# Patient Record
Sex: Male | Born: 1940 | Race: White | Hispanic: Yes | Smoking: Former smoker
Health system: Southern US, Community
[De-identification: ages and names within clinical notes are randomized; demographics above are authoritative.]

## PROBLEM LIST (undated history)

## (undated) DIAGNOSIS — A048 Other specified bacterial intestinal infections: Secondary | ICD-10-CM

## (undated) DIAGNOSIS — H402290 Chronic angle-closure glaucoma, unspecified eye, stage unspecified: Secondary | ICD-10-CM

## (undated) DIAGNOSIS — N281 Cyst of kidney, acquired: Secondary | ICD-10-CM

## (undated) DIAGNOSIS — I10 Essential (primary) hypertension: Secondary | ICD-10-CM

## (undated) DIAGNOSIS — K4090 Unilateral inguinal hernia, without obstruction or gangrene, not specified as recurrent: Secondary | ICD-10-CM

## (undated) DIAGNOSIS — M79651 Pain in right thigh: Secondary | ICD-10-CM

## (undated) DIAGNOSIS — M79646 Pain in unspecified finger(s): Secondary | ICD-10-CM

## (undated) DIAGNOSIS — M1990 Unspecified osteoarthritis, unspecified site: Secondary | ICD-10-CM

## (undated) DIAGNOSIS — E119 Type 2 diabetes mellitus without complications: Secondary | ICD-10-CM

## (undated) DIAGNOSIS — E781 Pure hyperglyceridemia: Secondary | ICD-10-CM

## (undated) DIAGNOSIS — H1030 Unspecified acute conjunctivitis, unspecified eye: Secondary | ICD-10-CM

## (undated) DIAGNOSIS — R197 Diarrhea, unspecified: Secondary | ICD-10-CM

## (undated) DIAGNOSIS — K1120 Sialoadenitis, unspecified: Secondary | ICD-10-CM

## (undated) DIAGNOSIS — N400 Enlarged prostate without lower urinary tract symptoms: Secondary | ICD-10-CM

## (undated) DIAGNOSIS — D126 Benign neoplasm of colon, unspecified: Secondary | ICD-10-CM

## (undated) DIAGNOSIS — J209 Acute bronchitis, unspecified: Secondary | ICD-10-CM

## (undated) DIAGNOSIS — K2970 Gastritis, unspecified, without bleeding: Secondary | ICD-10-CM

## (undated) DIAGNOSIS — F528 Other sexual dysfunction not due to a substance or known physiological condition: Secondary | ICD-10-CM

## (undated) DIAGNOSIS — R05 Cough: Secondary | ICD-10-CM

## (undated) DIAGNOSIS — M5431 Sciatica, right side: Secondary | ICD-10-CM

## (undated) DIAGNOSIS — Z860100 Personal history of colon polyps, unspecified: Secondary | ICD-10-CM

## (undated) DIAGNOSIS — K118 Other diseases of salivary glands: Secondary | ICD-10-CM

## (undated) DIAGNOSIS — B356 Tinea cruris: Secondary | ICD-10-CM

## (undated) DIAGNOSIS — K2990 Gastroduodenitis, unspecified, without bleeding: Secondary | ICD-10-CM

## (undated) DIAGNOSIS — R21 Rash and other nonspecific skin eruption: Secondary | ICD-10-CM

## (undated) DIAGNOSIS — M722 Plantar fascial fibromatosis: Secondary | ICD-10-CM

## (undated) DIAGNOSIS — E7800 Pure hypercholesterolemia, unspecified: Secondary | ICD-10-CM

## (undated) HISTORY — DX: Sialoadenitis, unspecified: K11.20

## (undated) HISTORY — DX: Acute bronchitis, unspecified: J20.9

## (undated) HISTORY — PX: OTHER SURGICAL HISTORY: SHX169

## (undated) HISTORY — DX: Unilateral inguinal hernia, without obstruction or gangrene, not specified as recurrent: K40.90

## (undated) HISTORY — DX: Rash and other nonspecific skin eruption: R21

## (undated) HISTORY — DX: Cyst of kidney, acquired: N28.1

## (undated) HISTORY — DX: Diarrhea, unspecified: R19.7

## (undated) HISTORY — DX: Tinea cruris: B35.6

## (undated) HISTORY — DX: Chronic angle-closure glaucoma, unspecified eye, stage unspecified: H40.2290

## (undated) HISTORY — DX: Pain in unspecified finger(s): M79.646

## (undated) HISTORY — DX: Benign prostatic hyperplasia without lower urinary tract symptoms: N40.0

## (undated) HISTORY — DX: Type 2 diabetes mellitus without complications: E11.9

## (undated) HISTORY — DX: Cough: R05

## (undated) HISTORY — DX: Essential (primary) hypertension: I10

## (undated) HISTORY — DX: Pain in right thigh: M79.651

## (undated) HISTORY — DX: Other specified bacterial intestinal infections: A04.8

## (undated) HISTORY — DX: Personal history of colonic polyps: Z86.010

## (undated) HISTORY — DX: Pure hypercholesterolemia, unspecified: E78.00

## (undated) HISTORY — DX: Gastritis, unspecified, without bleeding: K29.70

## (undated) HISTORY — DX: Pure hyperglyceridemia: E78.1

## (undated) HISTORY — PX: CATARACT EXTRACTION: SUR2

## (undated) HISTORY — PX: HERNIA REPAIR: SHX51

## (undated) HISTORY — DX: Personal history of colon polyps, unspecified: Z86.0100

## (undated) HISTORY — DX: Gastroduodenitis, unspecified, without bleeding: K29.90

## (undated) HISTORY — DX: Unspecified acute conjunctivitis, unspecified eye: H10.30

## (undated) HISTORY — DX: Other diseases of salivary glands: K11.8

## (undated) HISTORY — DX: Other sexual dysfunction not due to a substance or known physiological condition: F52.8

## (undated) HISTORY — DX: Benign neoplasm of colon, unspecified: D12.6

## (undated) HISTORY — DX: Unspecified osteoarthritis, unspecified site: M19.90

## (undated) HISTORY — DX: Plantar fascial fibromatosis: M72.2

## (undated) HISTORY — PX: EYE SURGERY: SHX253

## (undated) HISTORY — DX: Sciatica, right side: M54.31

---

## 1997-11-08 ENCOUNTER — Ambulatory Visit (HOSPITAL_COMMUNITY): Admission: RE | Admit: 1997-11-08 | Discharge: 1997-11-08 | Payer: Self-pay | Admitting: Orthopedic Surgery

## 1998-11-01 ENCOUNTER — Other Ambulatory Visit: Admission: RE | Admit: 1998-11-01 | Discharge: 1998-11-01 | Payer: Self-pay | Admitting: Gastroenterology

## 1999-01-25 ENCOUNTER — Ambulatory Visit (HOSPITAL_BASED_OUTPATIENT_CLINIC_OR_DEPARTMENT_OTHER): Admission: RE | Admit: 1999-01-25 | Discharge: 1999-01-25 | Payer: Self-pay

## 1999-12-16 ENCOUNTER — Encounter: Payer: Self-pay | Admitting: Family Medicine

## 1999-12-16 ENCOUNTER — Encounter: Admission: RE | Admit: 1999-12-16 | Discharge: 1999-12-16 | Payer: Self-pay | Admitting: Family Medicine

## 1999-12-30 ENCOUNTER — Encounter: Admission: RE | Admit: 1999-12-30 | Discharge: 1999-12-30 | Payer: Self-pay | Admitting: Family Medicine

## 1999-12-30 ENCOUNTER — Encounter: Payer: Self-pay | Admitting: Family Medicine

## 2001-12-27 ENCOUNTER — Observation Stay (HOSPITAL_COMMUNITY): Admission: RE | Admit: 2001-12-27 | Discharge: 2001-12-28 | Payer: Self-pay | Admitting: Orthopedic Surgery

## 2005-05-25 ENCOUNTER — Encounter: Admission: RE | Admit: 2005-05-25 | Discharge: 2005-05-25 | Payer: Self-pay | Admitting: Gastroenterology

## 2005-07-24 ENCOUNTER — Encounter: Admission: RE | Admit: 2005-07-24 | Discharge: 2005-07-24 | Payer: Self-pay | Admitting: Gastroenterology

## 2007-06-24 ENCOUNTER — Encounter: Admission: RE | Admit: 2007-06-24 | Discharge: 2007-06-24 | Payer: Self-pay | Admitting: Gastroenterology

## 2008-05-01 ENCOUNTER — Ambulatory Visit: Payer: Self-pay | Admitting: Internal Medicine

## 2008-05-01 DIAGNOSIS — E7800 Pure hypercholesterolemia, unspecified: Secondary | ICD-10-CM

## 2008-05-01 DIAGNOSIS — D126 Benign neoplasm of colon, unspecified: Secondary | ICD-10-CM

## 2008-05-01 DIAGNOSIS — I152 Hypertension secondary to endocrine disorders: Secondary | ICD-10-CM | POA: Insufficient documentation

## 2008-05-01 DIAGNOSIS — I10 Essential (primary) hypertension: Secondary | ICD-10-CM | POA: Insufficient documentation

## 2008-05-01 DIAGNOSIS — Z860100 Personal history of colon polyps, unspecified: Secondary | ICD-10-CM | POA: Insufficient documentation

## 2008-05-01 HISTORY — DX: Benign neoplasm of colon, unspecified: D12.6

## 2008-05-01 LAB — CONVERTED CEMR LAB
Alkaline Phosphatase: 51 units/L (ref 39–117)
Bilirubin, Direct: 0.2 mg/dL (ref 0.0–0.3)
CO2: 31 meq/L (ref 19–32)
Crystals: NEGATIVE
Eosinophils Relative: 2.5 % (ref 0.0–5.0)
GFR calc Af Amer: 85 mL/min
Glucose, Bld: 98 mg/dL (ref 70–99)
HCT: 44.1 % (ref 39.0–52.0)
Lymphocytes Relative: 30.5 % (ref 12.0–46.0)
Monocytes Relative: 13.1 % — ABNORMAL HIGH (ref 3.0–12.0)
Neutrophils Relative %: 53.1 % (ref 43.0–77.0)
Nitrite: NEGATIVE
Platelets: 212 10*3/uL (ref 150–400)
Potassium: 3.8 meq/L (ref 3.5–5.1)
Sodium: 140 meq/L (ref 135–145)
Specific Gravity, Urine: 1.02 (ref 1.000–1.03)
TSH: 2.81 microintl units/mL (ref 0.35–5.50)
Total Bilirubin: 1.4 mg/dL — ABNORMAL HIGH (ref 0.3–1.2)
Total Protein, Urine: NEGATIVE mg/dL
Total Protein: 7 g/dL (ref 6.0–8.3)
Urine Glucose: NEGATIVE mg/dL
WBC: 5.9 10*3/uL (ref 4.5–10.5)

## 2008-05-02 ENCOUNTER — Encounter: Payer: Self-pay | Admitting: Internal Medicine

## 2008-05-04 ENCOUNTER — Encounter: Payer: Self-pay | Admitting: Internal Medicine

## 2008-05-04 ENCOUNTER — Encounter: Admission: RE | Admit: 2008-05-04 | Discharge: 2008-05-04 | Payer: Self-pay | Admitting: Internal Medicine

## 2008-05-21 ENCOUNTER — Ambulatory Visit: Payer: Self-pay | Admitting: Internal Medicine

## 2008-05-21 LAB — CONVERTED CEMR LAB
Cholesterol, target level: 200 mg/dL
HDL goal, serum: 40 mg/dL

## 2008-08-25 ENCOUNTER — Ambulatory Visit: Payer: Self-pay | Admitting: Internal Medicine

## 2008-08-25 LAB — CONVERTED CEMR LAB
ALT: 27 units/L (ref 0–53)
AST: 24 units/L (ref 0–37)
Albumin: 3.9 g/dL (ref 3.5–5.2)
Amylase: 96 units/L (ref 27–131)
BUN: 22 mg/dL (ref 6–23)
Bacteria, UA: NEGATIVE
Basophils Absolute: 0 10*3/uL (ref 0.0–0.1)
Basophils Relative: 0.5 % (ref 0.0–3.0)
Bilirubin Urine: NEGATIVE
CO2: 31 meq/L (ref 19–32)
Calcium: 9.1 mg/dL (ref 8.4–10.5)
Chloride: 106 meq/L (ref 96–112)
Cholesterol: 192 mg/dL (ref 0–200)
Creatinine, Ser: 0.8 mg/dL (ref 0.4–1.5)
Eosinophils Absolute: 0.2 10*3/uL (ref 0.0–0.7)
GFR calc non Af Amer: 102 mL/min
HCT: 43.4 % (ref 39.0–52.0)
Hemoglobin: 15.4 g/dL (ref 13.0–17.0)
Ketones, ur: NEGATIVE mg/dL
LDL Cholesterol: 115 mg/dL — ABNORMAL HIGH (ref 0–99)
Leukocytes, UA: NEGATIVE
Lipase: 32 units/L (ref 11.0–59.0)
Lymphocytes Relative: 35.1 % (ref 12.0–46.0)
MCHC: 35.6 g/dL (ref 30.0–36.0)
MCV: 92 fL (ref 78.0–100.0)
Monocytes Absolute: 0.7 10*3/uL (ref 0.1–1.0)
Neutro Abs: 2.5 10*3/uL (ref 1.4–7.7)
Nitrite: NEGATIVE
RBC: 4.72 M/uL (ref 4.22–5.81)
RDW: 11.9 % (ref 11.5–14.6)
Total Bilirubin: 1.2 mg/dL (ref 0.3–1.2)
Triglycerides: 184 mg/dL — ABNORMAL HIGH (ref 0–149)
pH: 5 (ref 5.0–8.0)

## 2008-08-26 ENCOUNTER — Encounter: Payer: Self-pay | Admitting: Internal Medicine

## 2008-08-26 ENCOUNTER — Telehealth: Payer: Self-pay | Admitting: Internal Medicine

## 2008-08-31 ENCOUNTER — Encounter: Payer: Self-pay | Admitting: Internal Medicine

## 2008-08-31 ENCOUNTER — Encounter: Admission: RE | Admit: 2008-08-31 | Discharge: 2008-08-31 | Payer: Self-pay | Admitting: Internal Medicine

## 2008-09-07 ENCOUNTER — Telehealth (INDEPENDENT_AMBULATORY_CARE_PROVIDER_SITE_OTHER): Payer: Self-pay | Admitting: *Deleted

## 2008-09-07 DIAGNOSIS — N281 Cyst of kidney, acquired: Secondary | ICD-10-CM

## 2008-09-16 ENCOUNTER — Telehealth (INDEPENDENT_AMBULATORY_CARE_PROVIDER_SITE_OTHER): Payer: Self-pay | Admitting: *Deleted

## 2008-09-17 ENCOUNTER — Telehealth (INDEPENDENT_AMBULATORY_CARE_PROVIDER_SITE_OTHER): Payer: Self-pay | Admitting: *Deleted

## 2008-09-30 ENCOUNTER — Telehealth: Payer: Self-pay | Admitting: Internal Medicine

## 2008-10-06 ENCOUNTER — Encounter: Payer: Self-pay | Admitting: Internal Medicine

## 2008-10-09 ENCOUNTER — Encounter: Admission: RE | Admit: 2008-10-09 | Discharge: 2008-10-09 | Payer: Self-pay | Admitting: Gastroenterology

## 2008-10-13 ENCOUNTER — Encounter: Payer: Self-pay | Admitting: Internal Medicine

## 2008-11-02 ENCOUNTER — Encounter: Payer: Self-pay | Admitting: Internal Medicine

## 2008-11-11 ENCOUNTER — Ambulatory Visit: Payer: Self-pay | Admitting: Internal Medicine

## 2008-11-11 ENCOUNTER — Encounter (INDEPENDENT_AMBULATORY_CARE_PROVIDER_SITE_OTHER): Payer: Self-pay | Admitting: *Deleted

## 2008-11-11 DIAGNOSIS — E119 Type 2 diabetes mellitus without complications: Secondary | ICD-10-CM

## 2008-11-11 LAB — CONVERTED CEMR LAB
ALT: 24 units/L (ref 0–53)
AST: 24 units/L (ref 0–37)
Alkaline Phosphatase: 46 units/L (ref 39–117)
BUN: 22 mg/dL (ref 6–23)
Basophils Relative: 0.4 % (ref 0.0–3.0)
Bilirubin Urine: NEGATIVE
Bilirubin, Direct: 0.2 mg/dL (ref 0.0–0.3)
Calcium: 9 mg/dL (ref 8.4–10.5)
Creatinine, Ser: 0.9 mg/dL (ref 0.4–1.5)
Creatinine,U: 90 mg/dL
Eosinophils Relative: 2.7 % (ref 0.0–5.0)
GFR calc non Af Amer: 88.72 mL/min (ref >60)
Hemoglobin, Urine: NEGATIVE
Hgb A1c MFr Bld: 6.3 % (ref 4.6–6.5)
Ketones, ur: NEGATIVE mg/dL
Leukocytes, UA: NEGATIVE
Lipase: 41 units/L (ref 11.0–59.0)
Lymphocytes Relative: 25.4 % (ref 12.0–46.0)
Lymphs Abs: 1.3 10*3/uL (ref 0.7–4.0)
Microalb Creat Ratio: 2.2 mg/g (ref 0.0–30.0)
Microalb, Ur: 0.2 mg/dL (ref 0.0–1.9)
Monocytes Relative: 11 % (ref 3.0–12.0)
Neutrophils Relative %: 60.5 % (ref 43.0–77.0)
Platelets: 157 10*3/uL (ref 150.0–400.0)
RBC: 4.65 M/uL (ref 4.22–5.81)
Specific Gravity, Urine: 1.025 (ref 1.000–1.030)
Total Bilirubin: 1.7 mg/dL — ABNORMAL HIGH (ref 0.3–1.2)
Total CK: 117 units/L (ref 7–232)
Total Protein: 6.4 g/dL (ref 6.0–8.3)
Urine Glucose: NEGATIVE mg/dL
WBC: 5 10*3/uL (ref 4.5–10.5)
pH: 5.5 (ref 5.0–8.0)

## 2008-11-12 ENCOUNTER — Encounter: Payer: Self-pay | Admitting: Internal Medicine

## 2008-11-23 ENCOUNTER — Ambulatory Visit (HOSPITAL_COMMUNITY): Admission: RE | Admit: 2008-11-23 | Discharge: 2008-11-23 | Payer: Self-pay | Admitting: Gastroenterology

## 2008-11-24 ENCOUNTER — Encounter: Admission: RE | Admit: 2008-11-24 | Discharge: 2008-12-17 | Payer: Self-pay | Admitting: Internal Medicine

## 2008-11-27 ENCOUNTER — Telehealth: Payer: Self-pay | Admitting: Internal Medicine

## 2008-12-18 ENCOUNTER — Ambulatory Visit: Payer: Self-pay | Admitting: Internal Medicine

## 2008-12-18 LAB — CONVERTED CEMR LAB: LDL Goal: 100 mg/dL

## 2008-12-21 ENCOUNTER — Encounter: Payer: Self-pay | Admitting: Internal Medicine

## 2009-02-24 ENCOUNTER — Telehealth: Payer: Self-pay | Admitting: Internal Medicine

## 2009-05-05 ENCOUNTER — Ambulatory Visit: Payer: Self-pay | Admitting: Internal Medicine

## 2009-05-05 ENCOUNTER — Telehealth: Payer: Self-pay | Admitting: Internal Medicine

## 2009-05-06 ENCOUNTER — Ambulatory Visit: Payer: Self-pay | Admitting: Internal Medicine

## 2009-05-06 LAB — CONVERTED CEMR LAB
BUN: 27 mg/dL — ABNORMAL HIGH (ref 6–23)
Chloride: 101 meq/L (ref 96–112)
GFR calc non Af Amer: 101.5 mL/min (ref >60)
Glucose, Bld: 94 mg/dL (ref 70–99)
Potassium: 4.4 meq/L (ref 3.5–5.1)

## 2009-05-11 ENCOUNTER — Ambulatory Visit: Payer: Self-pay | Admitting: Cardiology

## 2009-05-13 ENCOUNTER — Ambulatory Visit: Payer: Self-pay | Admitting: Internal Medicine

## 2009-05-13 LAB — CONVERTED CEMR LAB
Bilirubin Urine: NEGATIVE
Ketones, ur: NEGATIVE mg/dL
Total Protein, Urine: NEGATIVE mg/dL
pH: 7 (ref 5.0–8.0)

## 2009-06-03 ENCOUNTER — Ambulatory Visit: Payer: Self-pay | Admitting: Internal Medicine

## 2009-06-07 ENCOUNTER — Telehealth: Payer: Self-pay | Admitting: Internal Medicine

## 2009-06-09 ENCOUNTER — Ambulatory Visit: Payer: Self-pay | Admitting: Internal Medicine

## 2009-06-14 ENCOUNTER — Telehealth: Payer: Self-pay | Admitting: Internal Medicine

## 2009-07-06 ENCOUNTER — Ambulatory Visit: Payer: Self-pay | Admitting: Internal Medicine

## 2009-09-14 ENCOUNTER — Ambulatory Visit: Payer: Self-pay | Admitting: Internal Medicine

## 2009-09-14 LAB — CONVERTED CEMR LAB
ALT: 23 units/L (ref 0–53)
AST: 18 units/L (ref 0–37)
Albumin: 4.1 g/dL (ref 3.5–5.2)
Eosinophils Relative: 2.9 % (ref 0.0–5.0)
GFR calc non Af Amer: 101.39 mL/min (ref >60)
HCT: 44.4 % (ref 39.0–52.0)
Hemoglobin: 14.7 g/dL (ref 13.0–17.0)
Ketones, ur: NEGATIVE mg/dL
Leukocytes, UA: NEGATIVE
Lymphs Abs: 1.6 10*3/uL (ref 0.7–4.0)
Microalb Creat Ratio: 4.2 mg/g (ref 0.0–30.0)
Monocytes Relative: 13.5 % — ABNORMAL HIGH (ref 3.0–12.0)
Neutro Abs: 2.4 10*3/uL (ref 1.4–7.7)
Potassium: 3.9 meq/L (ref 3.5–5.1)
Sodium: 143 meq/L (ref 135–145)
Specific Gravity, Urine: >=1.03 (ref 1.000–1.030)
TSH: 2.17 microintl units/mL (ref 0.35–5.50)
Total Bilirubin: 1 mg/dL (ref 0.3–1.2)
WBC: 4.7 10*3/uL (ref 4.5–10.5)
pH: 5.5 (ref 5.0–8.0)

## 2009-10-15 ENCOUNTER — Encounter (INDEPENDENT_AMBULATORY_CARE_PROVIDER_SITE_OTHER): Payer: Self-pay | Admitting: *Deleted

## 2009-10-29 ENCOUNTER — Encounter (INDEPENDENT_AMBULATORY_CARE_PROVIDER_SITE_OTHER): Payer: Self-pay | Admitting: *Deleted

## 2009-11-01 ENCOUNTER — Ambulatory Visit: Payer: Self-pay | Admitting: Gastroenterology

## 2009-11-10 ENCOUNTER — Ambulatory Visit: Payer: Self-pay | Admitting: Gastroenterology

## 2009-11-19 ENCOUNTER — Ambulatory Visit: Payer: Self-pay | Admitting: Gastroenterology

## 2009-11-19 LAB — CONVERTED CEMR LAB
Ferritin: 108.3 ng/mL (ref 22.0–322.0)
Folate: 15.9 ng/mL
Iron: 105 ug/dL (ref 42–165)
Lipase: 21 units/L (ref 11.0–59.0)
Transferrin: 253.1 mg/dL (ref 212.0–360.0)
Vitamin B-12: 614 pg/mL (ref 211–911)

## 2009-11-24 ENCOUNTER — Ambulatory Visit (HOSPITAL_COMMUNITY): Admission: RE | Admit: 2009-11-24 | Discharge: 2009-11-24 | Payer: Self-pay | Admitting: Gastroenterology

## 2009-12-17 ENCOUNTER — Ambulatory Visit: Payer: Self-pay | Admitting: Gastroenterology

## 2009-12-17 ENCOUNTER — Encounter (INDEPENDENT_AMBULATORY_CARE_PROVIDER_SITE_OTHER): Payer: Self-pay | Admitting: *Deleted

## 2009-12-17 DIAGNOSIS — H402290 Chronic angle-closure glaucoma, unspecified eye, stage unspecified: Secondary | ICD-10-CM | POA: Insufficient documentation

## 2010-01-05 ENCOUNTER — Ambulatory Visit: Payer: Self-pay | Admitting: Gastroenterology

## 2010-01-05 DIAGNOSIS — K2970 Gastritis, unspecified, without bleeding: Secondary | ICD-10-CM | POA: Insufficient documentation

## 2010-01-05 DIAGNOSIS — K2990 Gastroduodenitis, unspecified, without bleeding: Secondary | ICD-10-CM

## 2010-01-10 ENCOUNTER — Ambulatory Visit: Payer: Self-pay | Admitting: Gastroenterology

## 2010-01-11 ENCOUNTER — Encounter: Payer: Self-pay | Admitting: Gastroenterology

## 2010-01-12 ENCOUNTER — Telehealth: Payer: Self-pay | Admitting: Gastroenterology

## 2010-01-13 ENCOUNTER — Telehealth: Payer: Self-pay | Admitting: Internal Medicine

## 2010-01-19 ENCOUNTER — Ambulatory Visit: Payer: Self-pay | Admitting: Internal Medicine

## 2010-01-24 ENCOUNTER — Ambulatory Visit: Payer: Self-pay | Admitting: Gastroenterology

## 2010-05-18 ENCOUNTER — Ambulatory Visit: Payer: Self-pay | Admitting: Internal Medicine

## 2010-05-18 DIAGNOSIS — F528 Other sexual dysfunction not due to a substance or known physiological condition: Secondary | ICD-10-CM

## 2010-05-18 LAB — CONVERTED CEMR LAB
ALT: 26 units/L (ref 0–53)
Albumin: 4 g/dL (ref 3.5–5.2)
BUN: 18 mg/dL (ref 6–23)
Basophils Absolute: 0 10*3/uL (ref 0.0–0.1)
CO2: 31 meq/L (ref 19–32)
Chloride: 106 meq/L (ref 96–112)
Glucose, Bld: 127 mg/dL — ABNORMAL HIGH (ref 70–99)
HCT: 44.5 % (ref 39.0–52.0)
Hemoglobin: 15.5 g/dL (ref 13.0–17.0)
Hgb A1c MFr Bld: 6.6 % — ABNORMAL HIGH (ref 4.6–6.5)
Lymphs Abs: 1.6 10*3/uL (ref 0.7–4.0)
MCV: 93.5 fL (ref 78.0–100.0)
Monocytes Absolute: 0.6 10*3/uL (ref 0.1–1.0)
Neutro Abs: 2.8 10*3/uL (ref 1.4–7.7)
PSA: 1.68 ng/mL (ref 0.10–4.00)
Platelets: 199 10*3/uL (ref 150.0–400.0)
Potassium: 4.1 meq/L (ref 3.5–5.1)
RDW: 13.5 % (ref 11.5–14.6)
Sodium: 141 meq/L (ref 135–145)
TSH: 2.98 microintl units/mL (ref 0.35–5.50)
Total Bilirubin: 1.1 mg/dL (ref 0.3–1.2)

## 2010-06-14 ENCOUNTER — Ambulatory Visit: Payer: Self-pay | Admitting: Internal Medicine

## 2010-08-28 ENCOUNTER — Encounter: Payer: Self-pay | Admitting: Gastroenterology

## 2010-09-08 NOTE — Progress Notes (Signed)
Summary: CT scan coverage denied  Phone Note Outgoing Call   Summary of Call: Insurance has denied coverage for the CT scan that is sched for next week.  Denied because there is no evidence of physical findings related to the pain and no recent imaging studies demonstrating suspicion of a lesion.   Initial call taken by: Ashok Cordia RN,  January 12, 2010 10:07 AM  Follow-up for Phone Call        noted,,,offer referral to [facility] if they are interested. Follow-up by: Mardella Layman MD Clementeen Graham,  January 12, 2010 12:16 PM  Additional Follow-up for Phone Call Additional follow up Details #1::        LM for pt or wife to call.  Ashok Cordia RN  January 12, 2010 12:20 PM  Talked with wife.  She will discuss with pt and call back if pt wants referral. Additional Follow-up by: Ashok Cordia RN,  January 12, 2010 3:17 PM

## 2010-09-08 NOTE — Assessment & Plan Note (Signed)
Summary: discuss CT scan ins denial...as.   History of Present Illness Visit Type: Follow-up Visit Primary GI MD: Sheryn Bison MD FACP FAGA Primary Provider: Sanda Linger, MD Chief Complaint: pt continues to have abdominal pain, would like to discuss need for appeal to insurance company for CT--insurance has denied History of Present Illness:   Continued mid abdominal discomfort without alleviating or aggravating factors. No other GI symptoms. I reviewed all of his records again including records from [facility]. He did have abdominal beds pelvic CT scan in October of 20 and which was unremarkable. He has had no anorexia, weight loss, or systemic symptomatology. His blood pressure is well controlled and he is on probiotic therapy and p.r.n. tramadol.   GI Review of Systems    Reports abdominal pain.      Denies acid reflux, belching, bloating, chest pain, dysphagia with liquids, dysphagia with solids, heartburn, loss of appetite, nausea, vomiting, vomiting blood, weight loss, and  weight gain.        Denies anal fissure, black tarry stools, change in bowel habit, constipation, diarrhea, diverticulosis, fecal incontinence, heme positive stool, hemorrhoids, irritable bowel syndrome, jaundice, light color stool, liver problems, rectal bleeding, and  rectal pain.    Current Medications (verified): 1)  Pravachol 80 Mg Tabs (Pravastatin Sodium) .... Take 1 Tablet By Mouth Once A Day 2)  Freestyle Lite Test  Strp (Glucose Blood) .... Test Once Daily Dx: 250.00 3)  Freestyle Unistick Ii Lancets  Misc (Lancets) .... Test Once Daily As Directed Dx: 250.00 4)  Dorzolamide Hcl-Timolol Mal 22.3-6.8 Mg/ml Soln (Dorzolamide Hcl-Timolol Mal) .... Apply One Drop Daily To Right Eye 5)  Nature's Code For Men Over 50 .... One Packet Per Day 6)  Micro-K 10 Meq Cr-Caps (Potassium Chloride) .... One By Mouth Three Times A Day With Food 7)  Phillips Colon Health  Caps (Probiotic Product) .... Two Times A Day 8)   Tramadol Hcl 50 Mg Tabs (Tramadol Hcl) .Marland Kitchen.. 1 By Mouth Q 6-8 Hrs As Needed Pain 9)  Losartan Potassium-Hctz 100-12.5 Mg Tabs (Losartan Potassium-Hctz) .... One By Mouth Once Daily For High Blood Pressure  Allergies (verified): 1)  ! Lisinopril (Lisinopril)  Past History:  Past medical, surgical, family and social histories (including risk factors) reviewed for relevance to current acute and chronic problems.  Past Medical History: Reviewed history from 05/05/2009 and no changes required. Hypertension Hypercholesterolemia Colonic polyps, hx of Helicobacter pylori positive  Past Surgical History: Inguinal herniorrhaphy left Rotator cuff repair right wrist surgery eye surgery right-glaucoma (to relieve pressure)  Family History: Reviewed history from 11/19/2009 and no changes required. Family History Diabetes 1st degree relative: Mother, Brother Family History Hypertension No FH of Colon Cancer:  Social History: Reviewed history from 11/19/2009 and no changes required. Retired Alcohol use-no Drug use-no Patient is a former smoker. -stopped 25 years ago Patient does not get regular exercise.   Review of Systems  The patient denies allergy/sinus, anemia, anxiety-new, arthritis/joint pain, back pain, blood in urine, breast changes/lumps, change in vision, confusion, cough, coughing up blood, depression-new, fainting, fatigue, fever, headaches-new, hearing problems, heart murmur, heart rhythm changes, itching, menstrual pain, muscle pains/cramps, night sweats, nosebleeds, pregnancy symptoms, shortness of breath, skin rash, sleeping problems, sore throat, swelling of feet/legs, swollen lymph glands, thirst - excessive, urination - excessive, urination changes/pain, urine leakage, and voice change.    Vital Signs:  Patient profile:   70 year old male Height:      62.4 inches Weight:  156 pounds BMI:     28.27 Pulse rate:   64 / minute Pulse rhythm:   regular BP sitting:    110 / 58  (left arm) Cuff size:   regular  Vitals Entered By: Francee Piccolo CMA Duncan Dull) (January 24, 2010 3:48 PM)  Physical Exam  General:  Well developed, well nourished, no acute distress.healthy appearing.   Head:  Normocephalic and atraumatic. Eyes:  PERRLA, no icterus.exam deferred to patient's ophthalmologist.   Abdomen:  Soft, nontender and nondistended. No masses, hepatosplenomegaly or hernias noted. Normal bowel sounds. Psych:  Alert and cooperative. Normal mood and affect.depressed affect.     Impression & Recommendations:  Problem # 1:  ABDOMINAL PAIN-EPIGASTRIC (ICD-789.06) Assessment Unchanged Unexplained mid abdominal pain of unknown etiology, probable functional in nature. I have advised acupuncture therapy before proceeding further. I also have offered referral to a tertiary Medical Center if desired.  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 Phone number  [phone]. 2)  The medication list was reviewed and reconciled.  All changed / newly prescribed medications were explained.  A complete medication list was provided to the patient / caregiver. 3)  Copy sent to : Dr. Sanda Linger 4)  Please continue current medications.

## 2010-09-08 NOTE — Miscellaneous (Signed)
Summary: [facility]  Clinical Lists Changes  Medications: Added new medication of MOVIPREP 100 GM  SOLR (PEG-KCL-NACL-NASULF-NA ASC-C) As per prep instructions. - Signed Rx of MOVIPREP 100 GM  SOLR (PEG-KCL-NACL-NASULF-NA ASC-C) As per prep instructions.;  #1 x 0;  Signed;  Entered by: Ezra Sites RN;  Authorized by: Mardella Layman MD [facility];  Method used: Electronically to [facility]. [phone]*, [address] [place], [place]  [address], Ph: [phone] or [phone], Fax: [phone] Observations: Added new observation of ALLERGY REV: Done (11/01/2009 8:52)    Prescriptions: MOVIPREP 100 GM  SOLR (PEG-KCL-NACL-NASULF-NA ASC-C) As per prep instructions.  #1 x 0   Entered by:   Ezra Sites RN   Authorized by:   Mardella Layman MD [facility]   Signed by:   Ezra Sites RN on 11/01/2009   Method used:   [facility]ron[facility] to        [facility]. [phone]* (retail)       [address]       [place], [place]  [address]       Ph: [phone] or [phone]       Fax: [phone]   RxID:   [id number]

## 2010-09-08 NOTE — Assessment & Plan Note (Signed)
Summary: 4 mos f/u #/cd   Vital Signs:  Patient profile:   70 year old male Height:      62.4 inches Weight:      154 pounds BMI:     27.91 O2 Sat:      96 % Temp:     97.0 degrees F oral Pulse rate:   52 / minute Pulse rhythm:   regular Resp:     16 per minute BP sitting:   118 / 64  (left arm) Cuff size:   large  Vitals Entered By: Rock Nephew CMA (May 18, 2010 11:19 AM)  Nutrition Counseling: Patient's BMI is greater than 25 and therefore counseled on weight management options.  Primary Care Provider:  Sanda Linger, MD   History of Present Illness:  Follow-Up Visit      This is a 70 year old man who presents for Follow-up visit.  The patient denies chest pain, palpitations, dizziness, syncope, low blood sugar symptoms, high blood sugar symptoms, edema, SOB, DOE, PND, and orthopnea.  The patient reports taking meds as prescribed, monitoring BP, monitoring blood sugars, and dietary compliance.  When questioned about possible medication side effects, the patient notes none.    Preventive Screening-Counseling & Management  Alcohol-Tobacco     Alcohol drinks/day: 0     Smoking Status: quit     Passive Smoke Exposure: no     Tobacco Counseling: to remain off tobacco products  Hep-HIV-STD-Contraception     Hepatitis Risk: no risk noted     HIV Risk: no risk noted     STD Risk: no risk noted      Sexual History:  currently monogamous.        Drug Use:  never.        Blood Transfusions:  no.    Clinical Review Panels:  Prevention   Last Colonoscopy:  DONE (11/10/2009)   Last PSA:  1.84 (05/01/2008)  Immunizations   Last Tetanus Booster:  Td (06/13/2002)   Last Flu Vaccine:  Fluvax 3+ (05/18/2010)  Lipid Management   Cholesterol:  192 (08/25/2008)   LDL (bad choesterol):  115 (08/25/2008)   HDL (good cholesterol):  40.2 (08/25/2008)  Diabetes Management   HgBA1C:  6.5 (09/14/2009)   Creatinine:  0.6 (01/10/2010)   Last Dilated Eye Exam:  normal  (05/10/2010)   Last Foot Exam:  yes (05/18/2010)   Last Flu Vaccine:  Fluvax 3+ (05/18/2010)  CBC   WBC:  4.7 (09/14/2009)   RBC:  4.73 (09/14/2009)   Hgb:  14.7 (09/14/2009)   Hct:  44.4 (09/14/2009)   Platelets:  160.0 (09/14/2009)   MCV  93.8 (09/14/2009)   MCHC  33.1 (09/14/2009)   RDW  12.3 (09/14/2009)   PMN:  49.3 (09/14/2009)   Lymphs:  33.6 (09/14/2009)   Monos:  13.5 (09/14/2009)   Eosinophils:  2.9 (09/14/2009)   Basophil:  0.7 (09/14/2009)  Complete Metabolic Panel   Glucose:  113 (09/14/2009)   Sodium:  143 (09/14/2009)   Potassium:  3.9 (09/14/2009)   Chloride:  108 (09/14/2009)   CO2:  31 (09/14/2009)   BUN:  22 (01/10/2010)   Creatinine:  0.6 (01/10/2010)   Albumin:  4.1 (09/14/2009)   Total Protein:  6.9 (09/14/2009)   Calcium:  9.1 (09/14/2009)   Total Bili:  1.0 (09/14/2009)   Alk Phos:  55 (09/14/2009)   SGPT (ALT):  23 (09/14/2009)   SGOT (AST):  18 (09/14/2009)   Allergies: 1)  ! Lisinopril (Lisinopril)  Review  of Systems  The patient denies anorexia, fever, weight loss, weight gain, chest pain, syncope, dyspnea on exertion, peripheral edema, prolonged cough, headaches, hemoptysis, abdominal pain, melena, hematochezia, severe indigestion/heartburn, hematuria, suspicious skin lesions, difficulty walking, depression, enlarged lymph nodes, angioedema, and testicular masses.   GU:  Complains of erectile dysfunction; denies decreased libido, discharge, dysuria, hematuria, incontinence, nocturia, urinary frequency, and urinary hesitancy. Endo:  Denies cold intolerance, excessive hunger, excessive thirst, excessive urination, heat intolerance, polyuria, and weight change.  Physical Exam  General:  alert, well-developed, well-nourished, well-hydrated, appropriate dress, normal appearance, healthy-appearing, cooperative to examination, and good hygiene.   Head:  normocephalic and atraumatic.   Eyes:  No icterus Mouth:  no exudates, no posterior lymphoid  hypertrophy, no postnasal drip, no pharyngeal crowing, no lesions, no leukoplakia, no petechiae, and pharyngeal erythema.   Neck:  supple, full ROM, no masses, no thyromegaly, no carotid bruits, no cervical lymphadenopathy, and no neck tenderness.   Lungs:  Normal respiratory effort, chest expands symmetrically. Lungs are clear to auscultation, no crackles or wheezes. Heart:  Normal rate and regular rhythm. S1 and S2 normal without gallop, murmur, click, rub or other extra sounds. Abdomen:  soft, non-tender, normal bowel sounds, no distention, no masses, no guarding, no hepatomegaly, and no splenomegaly.   Rectal:  No external abnormalities noted. Normal sphincter tone. No rectal masses or tenderness. Genitalia:  uncircumcised, no hydrocele, no varicocele, no scrotal masses, no testicular masses or atrophy, no cutaneous lesions, and no urethral discharge.   Prostate:  no nodules, no asymmetry, no induration, and 1+ enlarged.   Msk:  normal ROM, no joint tenderness, and no joint swelling.   Pulses:  R and L carotid,radial,femoral,dorsalis pedis and posterior tibial pulses are full and equal bilaterally Extremities:  No clubbing, cyanosis, edema, or deformity noted with normal full range of motion of all joints.   Neurologic:  No cranial nerve deficits noted. Station and gait are normal. Plantar reflexes are down-going bilaterally. DTRs are symmetrical throughout. Sensory, motor and coordinative functions appear intact. Skin:  Intact without suspicious lesions or rashes Cervical Nodes:  no anterior cervical adenopathy and no posterior cervical adenopathy.   Axillary Nodes:  no R axillary adenopathy and no L axillary adenopathy.   Inguinal Nodes:  no R inguinal adenopathy and no L inguinal adenopathy.   Psych:  Cognition and judgment appear intact. Alert and cooperative with normal attention span and concentration. No apparent delusions, illusions, hallucinations  Diabetes Management Exam:    Foot  Exam (with socks and/or shoes not present):       Sensory-Pinprick/Light touch:          Left medial foot (L-4): normal          Left dorsal foot (L-5): normal          Left lateral foot (S-1): normal          Right medial foot (L-4): normal          Right dorsal foot (L-5): normal          Right lateral foot (S-1): normal       Sensory-Monofilament:          Left foot: normal          Right foot: normal       Inspection:          Left foot: normal          Right foot: normal       Nails:  Left foot: normal          Right foot: normal    Eye Exam:       Eye Exam done elsewhere          Date: 05/10/2010          Results: normal          Done by: hecker   Impression & Recommendations:  Problem # 1:  ERECTILE DYSFUNCTION (ICD-302.72) Assessment New  Orders: TLB-Testosterone, Total (84403-TESTO) TLB-PSA (Prostate Specific Antigen) (84153-PSA)  Problem # 2:  DIABETES MELLITUS (ICD-250.00) Assessment: Unchanged  His updated medication list for this problem includes:    Losartan Potassium-hctz 100-12.5 Mg Tabs (Losartan potassium-hctz) ..... One by mouth once daily for high blood pressure  Orders: Venipuncture (16109) TLB-BMP (Basic Metabolic Panel-BMET) (80048-METABOL) TLB-CBC Platelet - w/Differential (85025-CBCD) TLB-Hepatic/Liver Function Pnl (80076-HEPATIC) TLB-TSH (Thyroid Stimulating Hormone) (84443-TSH) TLB-A1C / Hgb A1C (Glycohemoglobin) (83036-A1C)  Labs Reviewed: Creat: 0.6 (01/10/2010)     Last Eye Exam: normal (05/10/2010) Reviewed HgBA1c results: 6.5 (09/14/2009)  6.3 (11/11/2008)  Problem # 3:  ESSENTIAL HYPERTENSION (ICD-401.9) Assessment: Improved  His updated medication list for this problem includes:    Losartan Potassium-hctz 100-12.5 Mg Tabs (Losartan potassium-hctz) ..... One by mouth once daily for high blood pressure  Orders: Venipuncture (60454) TLB-BMP (Basic Metabolic Panel-BMET) (80048-METABOL) TLB-CBC Platelet -  w/Differential (85025-CBCD) TLB-Hepatic/Liver Function Pnl (80076-HEPATIC) TLB-TSH (Thyroid Stimulating Hormone) (84443-TSH) TLB-A1C / Hgb A1C (Glycohemoglobin) (83036-A1C)  BP today: 118/64 Prior BP: 110/58 (01/24/2010)  Prior 10 Yr Risk Heart Disease: 47 % (07/06/2009)  Labs Reviewed: K+: 3.9 (09/14/2009) Creat: : 0.6 (01/10/2010)   Chol: 192 (08/25/2008)   HDL: 40.2 (08/25/2008)   LDL: 115 (08/25/2008)   TG: 184 (08/25/2008)  Complete Medication List: 1)  Pravachol 80 Mg Tabs (Pravastatin sodium) .... Take 1 tablet by mouth once a day 2)  Freestyle Lite Test Strp (Glucose blood) .... Test once daily dx: 250.00 3)  Freestyle Unistick Ii Lancets Misc (Lancets) .... Test once daily as directed dx: 250.00 4)  Dorzolamide Hcl-timolol Mal 22.3-6.8 Mg/ml Soln (Dorzolamide hcl-timolol mal) .... Apply one drop daily to right eye 5)  Tramadol Hcl 50 Mg Tabs (Tramadol hcl) .Marland Kitchen.. 1 by mouth q 6-8 hrs as needed pain 6)  Losartan Potassium-hctz 100-12.5 Mg Tabs (Losartan potassium-hctz) .... One by mouth once daily for high blood pressure  Other Orders: Flu Vaccine 91yrs + MEDICARE PATIENTS (U9811) Administration Flu vaccine - MCR (B1478)  Colorectal Screening:  Current Recommendations:    Hemoccult: NEG X 1 today  PSA Screening:    PSA: 1.84  (05/01/2008)    Reviewed PSA screening recommendations: PSA ordered  Immunization & Chemoprophylaxis:    Tetanus vaccine: Td  (06/13/2002)    Influenza vaccine: Fluvax 3+  (05/18/2010)  Patient Instructions: 1)  Please schedule a follow-up appointment in 1 month. 2)  It is important that you exercise regularly at least 20 minutes 5 times a week. If you develop chest pain, have severe difficulty breathing, or feel very tired , stop exercising immediately and seek medical attention. 3)  You need to lose weight. Consider a lower calorie diet and regular exercise.  4)  Check your blood sugars regularly. If your readings are usually above 200 or below  70 you should contact our office. 5)  It is important that your Diabetic A1c level is checked every 3 months. 6)  See your eye doctor yearly to check for diabetic eye damage. 7)  Check your feet each night  for sore areas, calluses or signs of infection. 8)  Check your Blood Pressure regularly. If it is above 130/80: you should make an appointment. Flu Vaccine Consent Questions     Do you have a history of severe allergic reactions to this vaccine? no    Any prior history of allergic reactions to egg and/or gelatin? no    Do you have a sensitivity to the preservative Thimersol? no    Do you have a past history of Guillan-Barre Syndrome? no    Do you currently have an acute febrile illness? no    Have you ever had a severe reaction to latex? no    Vaccine information given and explained to patient? yes    Are you currently pregnant? no    Lot Number:[id number]   Exp Date:02/04/2011   Site Given  Left Deltoid IM  [email or web address]

## 2010-09-08 NOTE — Assessment & Plan Note (Signed)
Summary: 1 mos f/u #/cd   Vital Signs:  Patient profile:   70 year old male Height:      62.4 inches Weight:      150.50 pounds O2 Sat:      97 % on Room air Temp:     97.2 degrees F oral Pulse rate:   60 / minute Pulse rhythm:   regular Resp:     16 per minute BP sitting:   110 / 60  (left arm)  Vitals Entered By: Rock Nephew CMA (June 14, 2010 11:15 AM)  O2 Flow:  Room air  Primary Care Provider:  Sanda Linger, MD   History of Present Illness:  Follow-Up Visit      This is a 70 year old man who presents for Follow-up visit.  The patient denies chest pain, palpitations, dizziness, syncope, low blood sugar symptoms, high blood sugar symptoms, edema, SOB, DOE, PND, and orthopnea.  Since the last visit the patient notes no new problems or concerns.  The patient reports taking meds as prescribed, monitoring BP, monitoring blood sugars, and dietary compliance.  When questioned about possible medication side effects, the patient notes sexual dysfunction.    Hypertension History:      He denies headache, chest pain, palpitations, dyspnea with exertion, orthopnea, PND, peripheral edema, visual symptoms, neurologic problems, and syncope.  He notes no problems with any antihypertensive medication side effects.        Positive major cardiovascular risk factors include male age 72 years old or older, diabetes, hyperlipidemia, and hypertension.  Negative major cardiovascular risk factors include negative family history for ischemic heart disease and non-tobacco-user status.        Further assessment for target organ damage reveals no history of ASHD, cardiac end-organ damage (CHF/LVH), stroke/TIA, peripheral vascular disease, renal insufficiency, or hypertensive retinopathy.     Preventive Screening-Counseling & Management  Alcohol-Tobacco     Alcohol drinks/day: 0     Alcohol Counseling: not indicated; patient does not drink     Smoking Status: quit     Passive Smoke Exposure: no     Tobacco Counseling: to remain off tobacco products  Hep-HIV-STD-Contraception     Hepatitis Risk: no risk noted     HIV Risk: no risk noted     STD Risk: no risk noted      Sexual History:  currently monogamous.        Drug Use:  never.        Blood Transfusions:  no.    Clinical Review Panels:  Prevention   Last Colonoscopy:  DONE (11/10/2009)   Last PSA:  1.68 (05/18/2010)  Immunizations   Last Tetanus Booster:  Td (06/13/2002)   Last Flu Vaccine:  Fluvax 3+ (05/18/2010)  Lipid Management   Cholesterol:  192 (08/25/2008)   LDL (bad choesterol):  115 (08/25/2008)   HDL (good cholesterol):  40.2 (08/25/2008)  Diabetes Management   HgBA1C:  6.6 (05/18/2010)   Creatinine:  0.7 (05/18/2010)   Last Dilated Eye Exam:  normal (05/10/2010)   Last Foot Exam:  yes (06/14/2010)   Last Flu Vaccine:  Fluvax 3+ (05/18/2010)  CBC   WBC:  5.1 (05/18/2010)   RBC:  4.76 (05/18/2010)   Hgb:  15.5 (05/18/2010)   Hct:  44.5 (05/18/2010)   Platelets:  199.0 (05/18/2010)   MCV  93.5 (05/18/2010)   MCHC  34.9 (05/18/2010)   RDW  13.5 (05/18/2010)   PMN:  54.1 (05/18/2010)   Lymphs:  30.6 (05/18/2010)  Monos:  11.9 (05/18/2010)   Eosinophils:  2.8 (05/18/2010)   Basophil:  0.6 (05/18/2010)  Complete Metabolic Panel   Glucose:  127 (05/18/2010)   Sodium:  141 (05/18/2010)   Potassium:  4.1 (05/18/2010)   Chloride:  106 (05/18/2010)   CO2:  31 (05/18/2010)   BUN:  18 (05/18/2010)   Creatinine:  0.7 (05/18/2010)   Albumin:  4.0 (05/18/2010)   Total Protein:  6.7 (05/18/2010)   Calcium:  9.2 (05/18/2010)   Total Bili:  1.1 (05/18/2010)   Alk Phos:  54 (05/18/2010)   SGPT (ALT):  26 (05/18/2010)   SGOT (AST):  19 (05/18/2010)   Medications Prior to Update: 1)  Pravachol 80 Mg Tabs (Pravastatin Sodium) .... Take 1 Tablet By Mouth Once A Day 2)  Freestyle Lite Test  Strp (Glucose Blood) .... Test Once Daily Dx: 250.00 3)  Freestyle Unistick Ii Lancets  Misc (Lancets) ....  Test Once Daily As Directed Dx: 250.00 4)  Dorzolamide Hcl-Timolol Mal 22.3-6.8 Mg/ml Soln (Dorzolamide Hcl-Timolol Mal) .... Apply One Drop Daily To Right Eye 5)  Tramadol Hcl 50 Mg Tabs (Tramadol Hcl) .Marland Kitchen.. 1 By Mouth Q 6-8 Hrs As Needed Pain 6)  Losartan Potassium-Hctz 100-12.5 Mg Tabs (Losartan Potassium-Hctz) .... One By Mouth Once Daily For High Blood Pressure  Current Medications (verified): 1)  Pravachol 80 Mg Tabs (Pravastatin Sodium) .... Take 1 Tablet By Mouth Once A Day 2)  Freestyle Lite Test  Strp (Glucose Blood) .... Test Once Daily Dx: 250.00 3)  Freestyle Unistick Ii Lancets  Misc (Lancets) .... Test Once Daily As Directed Dx: 250.00 4)  Dorzolamide Hcl-Timolol Mal 22.3-6.8 Mg/ml Soln (Dorzolamide Hcl-Timolol Mal) .... Apply One Drop Daily To Right Eye 5)  Tramadol Hcl 50 Mg Tabs (Tramadol Hcl) .Marland Kitchen.. 1 By Mouth Q 6-8 Hrs As Needed Pain 6)  Losartan Potassium-Hctz 100-12.5 Mg Tabs (Losartan Potassium-Hctz) .... One By Mouth Once Daily For High Blood Pressure 7)  Cialis 20 Mg Tabs (Tadalafil) .... Take As Directed  Allergies (verified): 1)  ! Lisinopril (Lisinopril)  Past History:  Past Medical History: Last updated: 05/05/2009 Hypertension Hypercholesterolemia Colonic polyps, hx of Helicobacter pylori positive  Past Surgical History: Last updated: 01/24/2010 Inguinal herniorrhaphy left Rotator cuff repair right wrist surgery eye surgery right-glaucoma (to relieve pressure)  Family History: Last updated: 11/19/2009 Family History Diabetes 1st degree relative: Mother, Brother Family History Hypertension No FH of Colon Cancer:  Social History: Last updated: 11/19/2009 Retired Alcohol use-no Drug use-no Patient is a former smoker. -stopped 25 years ago Patient does not get regular exercise.   Risk Factors: Alcohol Use: 0 (06/14/2010) Exercise: no (11/19/2009)  Risk Factors: Smoking Status: quit (06/14/2010) Passive Smoke Exposure: no  (06/14/2010)  Family History: Reviewed history from 11/19/2009 and no changes required. Family History Diabetes 1st degree relative: Mother, Brother Family History Hypertension No FH of Colon Cancer:  Social History: Reviewed history from 11/19/2009 and no changes required. Retired Alcohol use-no Drug use-no Patient is a former smoker. -stopped 25 years ago Patient does not get regular exercise.   Review of Systems  The patient denies anorexia, weight loss, weight gain, chest pain, syncope, dyspnea on exertion, peripheral edema, prolonged cough, headaches, hemoptysis, abdominal pain, hematuria, suspicious skin lesions, difficulty walking, depression, and abnormal bleeding.   GU:  Complains of erectile dysfunction; denies decreased libido, discharge, dysuria, hematuria, incontinence, nocturia, urinary frequency, and urinary hesitancy. Endo:  Denies cold intolerance, excessive hunger, excessive thirst, excessive urination, heat intolerance, polyuria, and weight change.  Physical  Exam  General:  alert, well-developed, well-nourished, well-hydrated, appropriate dress, normal appearance, healthy-appearing, cooperative to examination, and good hygiene.   Mouth:  no exudates, no posterior lymphoid hypertrophy, no postnasal drip, no pharyngeal crowing, no lesions, no leukoplakia, no petechiae, and pharyngeal erythema.   Neck:  supple, full ROM, no masses, no thyromegaly, no carotid bruits, no cervical lymphadenopathy, and no neck tenderness.   Lungs:  Normal respiratory effort, chest expands symmetrically. Lungs are clear to auscultation, no crackles or wheezes. Heart:  Normal rate and regular rhythm. S1 and S2 normal without gallop, murmur, click, rub or other extra sounds. Abdomen:  soft, non-tender, normal bowel sounds, no distention, no masses, no guarding, no hepatomegaly, and no splenomegaly.   Msk:  normal ROM, no joint tenderness, and no joint swelling.   Pulses:  R and L  carotid,radial,femoral,dorsalis pedis and posterior tibial pulses are full and equal bilaterally Extremities:  No clubbing, cyanosis, edema, or deformity noted with normal full range of motion of all joints.   Neurologic:  No cranial nerve deficits noted. Station and gait are normal. Plantar reflexes are down-going bilaterally. DTRs are symmetrical throughout. Sensory, motor and coordinative functions appear intact. Skin:  Intact without suspicious lesions or rashes Cervical Nodes:  no anterior cervical adenopathy and no posterior cervical adenopathy.   Psych:  Cognition and judgment appear intact. Alert and cooperative with normal attention span and concentration. No apparent delusions, illusions, hallucinations  Diabetes Management Exam:    Foot Exam (with socks and/or shoes not present):       Sensory-Pinprick/Light touch:          Left medial foot (L-4): normal          Left dorsal foot (L-5): normal          Left lateral foot (S-1): normal          Right medial foot (L-4): normal          Right dorsal foot (L-5): normal          Right lateral foot (S-1): normal       Sensory-Monofilament:          Left foot: normal          Right foot: normal       Inspection:          Left foot: normal          Right foot: normal       Nails:          Left foot: normal          Right foot: normal   Impression & Recommendations:  Problem # 1:  ERECTILE DYSFUNCTION (ICD-302.72) Assessment Unchanged  His updated medication list for this problem includes:    Cialis 20 Mg Tabs (Tadalafil) .Marland Kitchen... Take as directed  Discussed proper use of medications, as well as side effects.   Problem # 2:  DIABETES MELLITUS (ICD-250.00) Assessment: Unchanged he does not want to start oral meds His updated medication list for this problem includes:    Losartan Potassium-hctz 100-12.5 Mg Tabs (Losartan potassium-hctz) ..... One by mouth once daily for high blood pressure  Labs Reviewed: Creat: 0.7  (05/18/2010)     Last Eye Exam: normal (05/10/2010) Reviewed HgBA1c results: 6.6 (05/18/2010)  6.5 (09/14/2009)  Problem # 3:  ESSENTIAL HYPERTENSION (ICD-401.9) Assessment: Improved  His updated medication list for this problem includes:    Losartan Potassium-hctz 100-12.5 Mg Tabs (Losartan potassium-hctz) ..... One by mouth once daily for high  blood pressure  Prior BP: 118/64 (05/18/2010)  Prior 10 Yr Risk Heart Disease: 47 % (07/06/2009)  Labs Reviewed: K+: 4.1 (05/18/2010) Creat: : 0.7 (05/18/2010)   Chol: 192 (08/25/2008)   HDL: 40.2 (08/25/2008)   LDL: 115 (08/25/2008)   TG: 184 (08/25/2008)  Problem # 4:  HYPERCHOLESTEROLEMIA (ICD-272.0) Assessment: Unchanged  His updated medication list for this problem includes:    Pravachol 80 Mg Tabs (Pravastatin sodium) .Marland Kitchen... Take 1 tablet by mouth once a day  Labs Reviewed: SGOT: 19 (05/18/2010)   SGPT: 26 (05/18/2010)  Lipid Goals: Chol Goal: 200 (05/21/2008)   HDL Goal: 40 (05/21/2008)   LDL Goal: 100 (12/18/2008)   TG Goal: 150 (05/21/2008)  10 Yr Risk Heart Disease: 33 % Prior 10 Yr Risk Heart Disease: 47 % (07/06/2009)   HDL:40.2 (08/25/2008)  LDL:115 (08/25/2008)  Chol:192 (08/25/2008)  Trig:184 (08/25/2008)  Complete Medication List: 1)  Pravachol 80 Mg Tabs (Pravastatin sodium) .... Take 1 tablet by mouth once a day 2)  Freestyle Lite Test Strp (Glucose blood) .... Test once daily dx: 250.00 3)  Freestyle Unistick Ii Lancets Misc (Lancets) .... Test once daily as directed dx: 250.00 4)  Dorzolamide Hcl-timolol Mal 22.3-6.8 Mg/ml Soln (Dorzolamide hcl-timolol mal) .... Apply one drop daily to right eye 5)  Tramadol Hcl 50 Mg Tabs (Tramadol hcl) .Marland Kitchen.. 1 by mouth q 6-8 hrs as needed pain 6)  Losartan Potassium-hctz 100-12.5 Mg Tabs (Losartan potassium-hctz) .... One by mouth once daily for high blood pressure 7)  Cialis 20 Mg Tabs (Tadalafil) .... Take as directed  Hypertension Assessment/Plan:      The patient's  hypertensive risk group is category C: Target organ damage and/or diabetes.  His calculated 10 year risk of coronary heart disease is 33 %.  Today's blood pressure is 110/60.  His blood pressure goal is < 130/80.  Patient Instructions: 1)  Please schedule a follow-up appointment in 4 months. 2)  It is important that you exercise regularly at least 20 minutes 5 times a week. If you develop chest pain, have severe difficulty breathing, or feel very tired , stop exercising immediately and seek medical attention. 3)  You need to lose weight. Consider a lower calorie diet and regular exercise.  4)  Check your blood sugars regularly. If your readings are usually above 200 or below 70 you should contact our office. 5)  It is important that your Diabetic A1c level is checked every 3 months. 6)  See your eye doctor yearly to check for diabetic eye damage. 7)  Check your feet each night for sore areas, calluses or signs of infection. 8)  Check your Blood Pressure regularly. If it is above 130/80: you should make an appointment. Prescriptions: CIALIS 20 MG TABS (TADALAFIL) Take as directed  #12 x 0   Entered and Authorized by:   Etta Grandchild MD   Signed by:   Etta Grandchild MD on 06/14/2010   Method used:   Samples Given   RxID:   [id number]    Orders Added: 1)  Est. Patient Level IV [83419]

## 2010-09-08 NOTE — Letter (Signed)
Summary: [facility] Instructions  [facility]  [address] [place], [place] [address]   Phone: [phone]  Fax: [phone]       Adrian [name]    24-Jun-1941    MRN: [id number]        Procedure Day Dorna Bloom:  Wednesday 11/10/2009     Arrival Time: 9:30 am      Procedure Time: 10:30 am     Location of Procedure:                    _ x_  [facility] (4th Floor)                        PREPARATION FOR COLONOSCOPY WITH MOVIPREP   Starting 5 days prior to your procedure Friday 4/1 do not eat nuts, seeds, popcorn, corn, beans, peas,  salads, or any raw vegetables.  Do not take any fiber supplements (e.g. Metamucil, Citrucel, and Benefiber).  THE DAY BEFORE YOUR PROCEDURE         DATE: Tuesday 4/5  1.  Drink clear liquids the entire day-NO SOLID FOOD  2.  Do not drink anything colored red or purple.  Avoid juices with pulp.  No orange juice.  3.  Drink at least 64 oz. (8 glasses) of fluid/clear liquids during the day to prevent dehydration and help the prep work efficiently.  CLEAR LIQUIDS INCLUDE: Water Jello Ice Popsicles Tea (sugar ok, no milk/cream) Powdered fruit flavored drinks Coffee (sugar ok, no milk/cream) Gatorade Juice: apple, white grape, white cranberry  Lemonade Clear bullion, consomm, broth Carbonated beverages (any kind) Strained chicken noodle soup Hard Candy                             4.  In the morning, mix first dose of MoviPrep solution:    Empty 1 Pouch A and 1 Pouch B into the disposable container    Add lukewarm drinking water to the top line of the container. Mix to dissolve    Refrigerate (mixed solution should be used within 24 hrs)  5.  Begin drinking the prep at 5:00 p.m. The MoviPrep container is divided by 4 marks.   Every 15 minutes drink the solution down to the next mark (approximately 8 oz) until the full liter is complete.   6.  Follow completed prep with 16 oz of clear liquid of your choice (Nothing  red or purple).  Continue to drink clear liquids until bedtime.  7.  Before going to bed, mix second dose of MoviPrep solution:    Empty 1 Pouch A and 1 Pouch B into the disposable container    Add lukewarm drinking water to the top line of the container. Mix to dissolve    Refrigerate  THE DAY OF YOUR PROCEDURE      DATE: Wednesday 4/6  Beginning at 5:30 a.m. (5 hours before procedure):         1. Every 15 minutes, drink the solution down to the next mark (approx 8 oz) until the full liter is complete.  2. Follow completed prep with 16 oz. of clear liquid of your choice.    3. You may drink clear liquids until 8:30 am (2 HOURS BEFORE PROCEDURE).   MEDICATION INSTRUCTIONS  Unless otherwise instructed, you should take regular prescription medications with a small sip of water   as early as possible the morning of  your procedure.           OTHER INSTRUCTIONS  You will need a responsible adult at least 72 years of age to accompany you and drive you home.   This person must remain in the waiting room during your procedure.  Wear loose fitting clothing that is easily removed.  Leave jewelry and other valuables at home.  However, you may wish to bring a book to read or  an iPod/MP3 player to listen to music as you wait for your procedure to start.  Remove all body piercing jewelry and leave at home.  Total time from sign-in until discharge is approximately 2-3 hours.  You should go home directly after your procedure and rest.  You can resume normal activities the  day after your procedure.  The day of your procedure you should not:   Drive   Make legal decisions   Operate machinery   Drink alcohol   Return to work  You will receive specific instructions about eating, activities and medications before you leave.    The above instructions have been reviewed and explained to me by   Ezra Sites RN  November 01, 2009 9:15 AM    I fully understand and can  verbalize these instructions _____________________________ Date _________

## 2010-09-08 NOTE — Progress Notes (Signed)
Summary: BP  Phone Note Call from Patient Call back at Home Phone [phone]   Summary of Call: Pt's wife called, bp today was 175/74. Should patient be concerned?  Initial call taken by: Lamar Sprinkles, CMA,  January 13, 2010 2:15 PM  Follow-up for Phone Call        if it remains elevated the yes, may need higher dose of meds Follow-up by: Etta Grandchild MD,  January 13, 2010 2:41 PM  Additional Follow-up for Phone Call Additional follow up Details #1::        No answer................Marland KitchenLamar Sprinkles, CMA  January 14, 2010 8:46 AM   Pt's wife infomed Additional Follow-up by: Lamar Sprinkles, CMA,  January 14, 2010 3:08 PM

## 2010-09-08 NOTE — Miscellaneous (Signed)
Summary: Orders Update-Clotest  Clinical Lists Changes  Problems: Added new problem of GASTRITIS (ICD-535.50) Orders: Added new Test order of TLB-H Pylori Screen Gastric Biopsy (83013-CLOTEST) - Signed 

## 2010-09-08 NOTE — Assessment & Plan Note (Signed)
Summary: abd pain...as.   History of Present Illness Visit Type: Initial Visit Primary GI MD: Sheryn Bison MD FACP FAGA Primary Provider: Sanda Linger, MD Chief Complaint: Patient c/o 4-5 years intermittent generalized abdominal pain which is normally sharp in nature. Patient's girlfriend states that this pain makes patient "double over in pain." He denies any change in bowels or any blood in the stool. There are no upper GI symptoms either. History of Present Illness:   70 year old Hispanic male who recently had screening colonoscopy which is unremarkable. He now gives a history of 4-5 years of cramping lower abdominal pain with some loose stools but no symptoms of malabsorption. He apparently been evaluated elsewhere with questionable history of H. pylori infection,but apparently was not treated. However, he denies dyspepsia or reflux symptoms, anorexia, weight loss, hepatobiliary or systemic complaints such as fever, chills, skin rashes, arthritis, or mouth sores. He denies any acidic food intolerances.  He is in good health except for mild hypercholesterolemia and hypertension. He denies peripheral vascular disease her current cardiopulmonary complaints. He apparently used ethanol heavily in his younger days, but does not drink at this time her abusing NSAIDs.   GI Review of Systems    Reports abdominal pain and  bloating.     Location of  Abdominal pain: generalized.    Denies acid reflux, belching, chest pain, dysphagia with liquids, dysphagia with solids, heartburn, loss of appetite, nausea, vomiting, vomiting blood, weight loss, and  weight gain.        Denies anal fissure, black tarry stools, change in bowel habit, constipation, diarrhea, diverticulosis, fecal incontinence, heme positive stool, hemorrhoids, irritable bowel syndrome, jaundice, light color stool, liver problems, rectal bleeding, and  rectal pain. Preventive Screening-Counseling & Management  Alcohol-Tobacco  Smoking Status: quit  Caffeine-Diet-Exercise     Does Patient Exercise: no    Current Medications (verified): 1)  Pravachol 80 Mg Tabs (Pravastatin Sodium) .... Take 1 Tablet By Mouth Once A Day 2)  Freestyle Lite Test  Strp (Glucose Blood) .... Test Once Daily Dx: 250.00 3)  Freestyle Unistick Ii Lancets  Misc (Lancets) .... Test Once Daily As Directed Dx: 250.00 4)  Dorzolamide Hcl-Timolol Mal 22.3-6.8 Mg/ml Soln (Dorzolamide Hcl-Timolol Mal) .... Apply One Drop Daily To Right Eye 5)  Nature's Code For Men Over 50 .... One Packet Per Day 6)  Diovan 80 Mg Tabs (Valsartan) .... Once Daily For High Blood Pressure 7)  Micro-K 10 Meq Cr-Caps (Potassium Chloride) .... One By Mouth Three Times A Day With Food  Allergies (verified): 1)  ! Lisinopril (Lisinopril)  Past History:  Past medical, surgical, family and social histories (including risk factors) reviewed for relevance to current acute and chronic problems.  Past Medical History: Reviewed history from 05/05/2009 and no changes required. Hypertension Hypercholesterolemia Colonic polyps, hx of Helicobacter pylori positive  Past Surgical History: Inguinal herniorrhaphy left Rotator cuff repair right wrist surgery  Family History: Reviewed history from 05/01/2008 and no changes required. Family History Diabetes 1st degree relative: Mother, Brother Family History Hypertension No FH of Colon Cancer:  Social History: Reviewed history from 05/01/2008 and no changes required. Retired Alcohol use-no Drug use-no Patient is a former smoker. -stopped 25 years ago Patient does not get regular exercise.  Smoking Status:  quit Does Patient Exercise:  no  Review of Systems       The patient complains of allergy/sinus, arthritis/joint pain, and headaches-new.  The patient denies anemia, anxiety-new, back pain, blood in urine, breast changes/lumps, change in vision,  confusion, cough, coughing up blood, depression-new, fainting,  fatigue, fever, hearing problems, heart murmur, heart rhythm changes, itching, menstrual pain, muscle pains/cramps, night sweats, nosebleeds, pregnancy symptoms, shortness of breath, skin rash, sleeping problems, sore throat, swelling of feet/legs, swollen lymph glands, thirst - excessive , urination - excessive , urination changes/pain, urine leakage, vision changes, and voice change.    Vital Signs:  Patient profile:   70 year old male Height:      62.4 inches Weight:      158.50 pounds BMI:     28.72 BSA:     1.74 Pulse rate:   56 / minute Pulse rhythm:   regular BP sitting:   114 / 64  (left arm)  Vitals Entered By: Hortense Ramal CMA Duncan Dull) (November 19, 2009 9:25 AM)  Physical Exam  General:  Well developed, well nourished, no acute distress.healthy appearing.  healthy appearing.   Head:  Normocephalic and atraumatic. Eyes:  PERRLA, no icterus.exam deferred to patient's ophthalmologist.  exam deferred to patient's ophthalmologist.   Neck:  Supple; no masses or thyromegaly. Lungs:  Clear throughout to auscultation. Heart:  Regular rate and rhythm; no murmurs, rubs,  or bruits. Abdomen:  Soft, nontender and nondistended. No masses, hepatosplenomegaly or hernias noted. Normal bowel sounds. Msk:  Symmetrical with no gross deformities. Normal posture. Pulses:  Normal pulses noted. Extremities:  No clubbing, cyanosis, edema or deformities noted. Neurologic:  Alert and  oriented x4;  grossly normal neurologically. Cervical Nodes:  No significant cervical adenopathy. Inguinal Nodes:  No significant inguinal adenopathy. Psych:  Alert and cooperative. Normal mood and affect.   Impression & Recommendations:  Problem # 1:  IRRITABLE BOWEL SYNDROME (ICD-564.1) Assessment Unchanged several labs ordered to complete his workup including H. pylori antibodies, Giardia antibodies, amylase, lipase, celiac panel, sed rate, and anemia profile. We will schedule upper abdominal ultrasound exam to  exclude cholelithiasis. I have started treatment with Librax one p.o. t.i.d. as tolerated to be adjusted if somnolence occurs to a lower dose, also [employer] tabs twice a day. I will see him back in several weeks' time and decide if further evaluation is needed including endoscopy with biopsies for H. pylori and intestinal biopsies. Other considerations would be occult chronic pancreatitis and low-grade malabsorption.  Problem # 2:  DIABETES MELLITUS (ICD-250.00) Assessment: Unchanged He has a hemoglobin A1c of 6.5 and is on a caloric restricted diet per Dr. Yetta Barre.  Problem # 3:  GERD, SEVERE (ICD-530.81) Assessment: Improved He denies GERD symptomatology today.  Problem # 4:  BENIGN NEOPLASM OF COLON (ICD-211.3) Assessment: Unchanged Followup colonoscopy as per clinical protocol.  Other Orders: TLB-Amylase (82150-AMYL) TLB-Lipase (83690-LIPASE) TLB-Sedimentation Rate (ESR) (85652-ESR) TLB-IgA (Immunoglobulin A) (82784-IGA) T-Sprue Panel (Celiac Disease Aby Eval) (83516x3/[address]) TLB-H. Pylori Abs(Helicobacter Pylori) (86677-HELICO) T-Giardia Lamblia IFA (95638-75643) TLB-B12, Serum-Total ONLY (32951-O84) TLB-Ferritin (82728-FER) TLB-Folic Acid (Folate) (82746-FOL) TLB-IBC Pnl (Iron/FE;Transferrin) (83550-IBC)  Patient Instructions: 1)  Please go to the basement for lab work. 2)  Begin Librax three times a day as needed. 3)  You are scheduled for an ultrasound. 4)  Begin [facility] two times a day. But this OTC. 5)  The medication list was reviewed and reconciled.  All changed / newly prescribed medications were explained.  A complete medication list was provided to the patient / caregiver. 6)  Copy sent to : Dr. Sanda Linger 7)  Please continue current medications.  8)  IBS brochure given.  9)  Please schedule a follow-up appointment in 1 month.  Appended Document: abd pain...as.    Clinical Lists Changes  Medications: Added new medication of  PHILLIPS COLON HEALTH  CAPS (PROBIOTIC PRODUCT) two times a day Added new medication of CLIDINIUM-CHLORDIAZEPOXIDE 2.5-5 MG CAPS (CLIDINIUM-CHLORDIAZEPOXIDE) 1 by mouth three times a day prn - Signed Rx of CLIDINIUM-CHLORDIAZEPOXIDE 2.5-5 MG CAPS (CLIDINIUM-CHLORDIAZEPOXIDE) 1 by mouth three times a day prn;  #90 x 3;  Signed;  Entered by: Ashok Cordia RN;  Authorized by: Mardella Layman MD [facility];  Method used: [facility]. [phone]*, [address], [place], [place]  [address], Ph: [phone] or [phone], Fax: [phone] Orders: Added new Test order of Ultrasound Abdomen (UAS) - Signed    Prescriptions: CLIDINIUM-CHLORDIAZEPOXIDE 2.5-5 MG CAPS (CLIDINIUM-CHLORDIAZEPOXIDE) 1 by mouth three times a day prn  #90 x 3   Entered by:   Ashok Cordia RN   Authorized by:   Mardella Layman MD [facility]   Signed by:   Ashok Cordia RN on 11/19/2009   Method used:   [facility]        [facility]. [phone]* (retail)       [address]       [place], [place]  [address]       Ph: [phone] or [phone]       Fax: [phone]   RxID:   [id number]

## 2010-09-08 NOTE — Assessment & Plan Note (Signed)
Summary: 2 mos f/u / # / cd   Vital Signs:  Patient profile:   70 year old male Height:      62.5 inches Weight:      157 pounds BMI:     28.36 O2 Sat:      95 % on Room air Temp:     97.4 degrees F oral Pulse rate:   66 / minute Resp:     16 per minute BP sitting:   140 / 70  (left arm)  Vitals Entered By: Lucious Groves (September 14, 2009 11:13 AM)  O2 Flow:  Room air CC: 2 MO. F/U--Pt states that he thinks he is doing somewhat better./kb, Preventive Care, Lipid Management Is Patient Diabetic? Yes Did you bring your meter with you today? No Pain Assessment Patient in pain? no        Primary Care Provider:  Etta Grandchild MD  CC:  2 MO. F/U--Pt states that he thinks he is doing somewhat better./kb, Preventive Care, and Lipid Management.  History of Present Illness:  Hypertension Follow-Up      This is a 70 year old man who presents for Hypertension follow-up.  The patient denies lightheadedness, urinary frequency, headaches, edema, impotence, rash, and fatigue.  The patient denies the following associated symptoms: chest pain, chest pressure, exercise intolerance, dyspnea, palpitations, syncope, leg edema, and pedal edema.  Compliance with medications (by patient report) has been near 100%.  The patient reports that dietary compliance has been fair.  The patient reports no exercise.    Lipid Management History:      Positive NCEP/ATP III risk factors include male age 41 years old or older, diabetes, and hypertension.  Negative NCEP/ATP III risk factors include no family history for ischemic heart disease, non-tobacco-user status, no ASHD (atherosclerotic heart disease), no prior stroke/TIA, no peripheral vascular disease, and no history of aortic aneurysm.        The patient states that he knows about the "Therapeutic Lifestyle Change" diet.  His compliance with the TLC diet is fair.  The patient expresses understanding of adjunctive measures for cholesterol lowering.   Adjunctive measures started by the patient include fiber, ASA, limit alcohol consumpton, and weight reduction.  He expresses no side effects from his lipid-lowering medication.  The patient denies any symptoms to suggest myopathy or liver disease.     Preventive Screening-Counseling & Management  Alcohol-Tobacco     Alcohol drinks/day: 0     Smoking Status: never     Passive Smoke Exposure: no  Hep-HIV-STD-Contraception     Hepatitis Risk: no risk noted     HIV Risk: no risk noted     STD Risk: no risk noted      Sexual History:  currently monogamous.        Drug Use:  never.        Blood Transfusions:  no.    Clinical Review Panels:  Lipid Management   Cholesterol:  192 (08/25/2008)   LDL (bad choesterol):  115 (08/25/2008)   HDL (good cholesterol):  40.2 (08/25/2008)  Diabetes Management   HgBA1C:  6.3 (11/11/2008)   Creatinine:  0.8 (05/06/2009)   Last Dilated Eye Exam:  normal (12/16/2008)   Last Foot Exam:  yes (09/14/2009)   Last Flu Vaccine:  Fluvax 3+ (05/05/2009)  CBC   WBC:  5.0 (11/11/2008)   RBC:  4.65 (11/11/2008)   Hgb:  15.0 (11/11/2008)   Hct:  43.8 (11/11/2008)   Platelets:  157.0 (  11/11/2008)   MCV  94.2 (11/11/2008)   MCHC  34.3 (11/11/2008)   RDW  12.5 (11/11/2008)   PMN:  60.5 (11/11/2008)   Lymphs:  25.4 (11/11/2008)   Monos:  11.0 (11/11/2008)   Eosinophils:  2.7 (11/11/2008)   Basophil:  0.4 (11/11/2008)  Complete Metabolic Panel   Glucose:  94 (05/06/2009)   Sodium:  139 (05/06/2009)   Potassium:  4.4 (05/06/2009)   Chloride:  101 (05/06/2009)   CO2:  32 (05/06/2009)   BUN:  27 (05/06/2009)   Creatinine:  0.8 (05/06/2009)   Albumin:  3.8 (11/11/2008)   Total Protein:  6.4 (11/11/2008)   Calcium:  8.8 (05/06/2009)   Total Bili:  1.7 (11/11/2008)   Alk Phos:  46 (11/11/2008)   SGPT (ALT):  24 (11/11/2008)   SGOT (AST):  24 (11/11/2008)   Medications Prior to Update: 1)  Pravachol 80 Mg Tabs (Pravastatin Sodium) .... Once  Daily 2)  Potassium Chloride 20 Meq Pack (Potassium Chloride) .... One By Mouth Three Times A Day With Food 3)  Freestyle Lite Test  Strp (Glucose Blood) .... Test Once Daily Dx: 250.00 4)  Freestyle Unistick Ii Lancets  Misc (Lancets) .... Test Once Daily As Directed Dx: 250.00 5)  Timolol .... As Directed 6)  Nature's Code For Men Over 50 .... One Packet Per Day 7)  Align  Caps (Misc Intestinal Flora Regulat) .... Once Daily 8)  Levbid 0.375 Mg Xr12h-Tab (Hyoscyamine Sulfate) .... One By Mouth Two Times A Day As Needed For Abd. Pain 9)  Mytussin Ac 100-10 Mg/63ml Syrp (Guaifenesin-Codeine) .... 5-10 Ml By Mouth Qid As Needed For Cough 10)  Diovan 80 Mg Tabs (Valsartan) .... Once Daily For High Blood Pressure  Current Medications (verified): 1)  Pravachol 80 Mg Tabs (Pravastatin Sodium) .... Once Daily 2)  Potassium Chloride 20 Meq Pack (Potassium Chloride) .... One By Mouth Three Times A Day With Food 3)  Freestyle Lite Test  Strp (Glucose Blood) .... Test Once Daily Dx: 250.00 4)  Freestyle Unistick Ii Lancets  Misc (Lancets) .... Test Once Daily As Directed Dx: 250.00 5)  Timolol .... As Directed 6)  Nature's Code For Men Over 50 .... One Packet Per Day 7)  Align  Caps (Misc Intestinal Flora Regulat) .... Once Daily 8)  Levbid 0.375 Mg Xr12h-Tab (Hyoscyamine Sulfate) .... One By Mouth Two Times A Day As Needed For Abd. Pain 9)  Mytussin Ac 100-10 Mg/13ml Syrp (Guaifenesin-Codeine) .... 5-10 Ml By Mouth Qid As Needed For Cough 10)  Diovan 80 Mg Tabs (Valsartan) .... Once Daily For High Blood Pressure  Allergies (verified): 1)  ! Lisinopril (Lisinopril)  Past History:  Past Medical History: Reviewed history from 05/05/2009 and no changes required. Hypertension Hypercholesterolemia Colonic polyps, hx of Helicobacter pylori positive  Past Surgical History: Reviewed history from 05/01/2008 and no changes required. Inguinal herniorrhaphy Rotator cuff repair  Family  History: Reviewed history from 05/01/2008 and no changes required. Family History Diabetes 1st degree relative Family History Hypertension  Social History: Reviewed history from 05/01/2008 and no changes required. Retired Married Never Smoked Alcohol use-no Drug use-no Regular exercise-yes  Review of Systems       The patient complains of weight gain.  The patient denies anorexia, fever, weight loss, chest pain, prolonged cough, headaches, hemoptysis, abdominal pain, melena, hematochezia, severe indigestion/heartburn, hematuria, difficulty walking, depression, enlarged lymph nodes, angioedema, and testicular masses.   Endo:  Denies cold intolerance, excessive hunger, excessive thirst, excessive urination, heat intolerance, and polyuria.  Physical Exam  General:  alert, well-developed, well-nourished, well-hydrated, appropriate dress, normal appearance, healthy-appearing, cooperative to examination, and good hygiene.   Head:  normocephalic and atraumatic.   Eyes:  No icterus Mouth:  no exudates, no posterior lymphoid hypertrophy, no postnasal drip, no pharyngeal crowing, no lesions, no leukoplakia, no petechiae, and pharyngeal erythema.   Neck:  supple, full ROM, no masses, no thyromegaly, no carotid bruits, no cervical lymphadenopathy, and no neck tenderness.   Lungs:  Normal respiratory effort, chest expands symmetrically. Lungs are clear to auscultation, no crackles or wheezes. Heart:  Normal rate and regular rhythm. S1 and S2 normal without gallop, murmur, click, rub or other extra sounds. Abdomen:  soft, non-tender, normal bowel sounds, no distention, no masses, no guarding, no hepatomegaly, and no splenomegaly.   Msk:  normal ROM, no joint tenderness, and no joint swelling.   Pulses:  R and L carotid,radial,femoral,dorsalis pedis and posterior tibial pulses are full and equal bilaterally Extremities:  No clubbing, cyanosis, edema, or deformity noted with normal full range of  motion of all joints.   Neurologic:  No cranial nerve deficits noted. Station and gait are normal. Plantar reflexes are down-going bilaterally. DTRs are symmetrical throughout. Sensory, motor and coordinative functions appear intact. Skin:  Intact without suspicious lesions or rashes Cervical Nodes:  No lymphadenopathy noted Axillary Nodes:  No palpable lymphadenopathy Inguinal Nodes:  No significant adenopathy Psych:  Cognition and judgment appear intact. Alert and cooperative with normal attention span and concentration. No apparent delusions, illusions, hallucinations  Diabetes Management Exam:    Foot Exam (with socks and/or shoes not present):       Sensory-Pinprick/Light touch:          Left medial foot (L-4): normal          Left dorsal foot (L-5): normal          Left lateral foot (S-1): normal          Right medial foot (L-4): normal          Right dorsal foot (L-5): normal          Right lateral foot (S-1): normal       Sensory-Monofilament:          Left foot: normal          Right foot: normal       Inspection:          Left foot: normal          Right foot: normal       Nails:          Left foot: normal          Right foot: normal   Impression & Recommendations:  Problem # 1:  DIABETES MELLITUS (ICD-250.00) Assessment Unchanged  His updated medication list for this problem includes:    Diovan 80 Mg Tabs (Valsartan) ..... Once daily for high blood pressure  Orders: Venipuncture (16109) TLB-BMP (Basic Metabolic Panel-BMET) (80048-METABOL) TLB-CBC Platelet - w/Differential (85025-CBCD) TLB-Hepatic/Liver Function Pnl (80076-HEPATIC) TLB-TSH (Thyroid Stimulating Hormone) (84443-TSH) TLB-A1C / Hgb A1C (Glycohemoglobin) (83036-A1C) TLB-Microalbumin/Creat Ratio, Urine (82043-MALB) TLB-Udip w/ Micro (81001-URINE)  Labs Reviewed: Creat: 0.8 (05/06/2009)     Last Eye Exam: normal (12/16/2008) Reviewed HgBA1c results: 6.3 (11/11/2008)  Problem # 2:  BENIGN  NEOPLASM OF COLON (ICD-211.3) Assessment: Unchanged it sounds like he needs a repeat colonoscopy, unfortunately he doesn't know the name of the previous GI doctor  Problem # 3:  HYPERCHOLESTEROLEMIA (ICD-272.0) Assessment: Unchanged  His  updated medication list for this problem includes:    Pravachol 80 Mg Tabs (Pravastatin sodium) ..... Once daily  Orders: Venipuncture (16109) TLB-BMP (Basic Metabolic Panel-BMET) (80048-METABOL) TLB-CBC Platelet - w/Differential (85025-CBCD) TLB-Hepatic/Liver Function Pnl (80076-HEPATIC) TLB-TSH (Thyroid Stimulating Hormone) (84443-TSH) TLB-A1C / Hgb A1C (Glycohemoglobin) (83036-A1C) TLB-Microalbumin/Creat Ratio, Urine (82043-MALB) TLB-Udip w/ Micro (81001-URINE)  Labs Reviewed: SGOT: 24 (11/11/2008)   SGPT: 24 (11/11/2008)  Lipid Goals: Chol Goal: 200 (05/21/2008)   HDL Goal: 40 (05/21/2008)   LDL Goal: 100 (12/18/2008)   TG Goal: 150 (05/21/2008)  Prior 10 Yr Risk Heart Disease: 47 % (07/06/2009)   HDL:40.2 (08/25/2008)  LDL:115 (08/25/2008)  Chol:192 (08/25/2008)  Trig:184 (08/25/2008)  Problem # 4:  ESSENTIAL HYPERTENSION (ICD-401.9) Assessment: Unchanged  His updated medication list for this problem includes:    Diovan 80 Mg Tabs (Valsartan) ..... Once daily for high blood pressure  Orders: Venipuncture (60454) TLB-BMP (Basic Metabolic Panel-BMET) (80048-METABOL) TLB-CBC Platelet - w/Differential (85025-CBCD) TLB-Hepatic/Liver Function Pnl (80076-HEPATIC) TLB-TSH (Thyroid Stimulating Hormone) (84443-TSH) TLB-A1C / Hgb A1C (Glycohemoglobin) (83036-A1C) TLB-Microalbumin/Creat Ratio, Urine (82043-MALB) TLB-Udip w/ Micro (81001-URINE)  BP today: 140/70 Prior BP: 148/76 (07/06/2009)  Prior 10 Yr Risk Heart Disease: 47 % (07/06/2009)  Labs Reviewed: K+: 4.4 (05/06/2009) Creat: : 0.8 (05/06/2009)   Chol: 192 (08/25/2008)   HDL: 40.2 (08/25/2008)   LDL: 115 (08/25/2008)   TG: 184 (08/25/2008)  Complete Medication List: 1)   Pravachol 80 Mg Tabs (Pravastatin sodium) .... Once daily 2)  Freestyle Lite Test Strp (Glucose blood) .... Test once daily dx: 250.00 3)  Freestyle Unistick Ii Lancets Misc (Lancets) .... Test once daily as directed dx: 250.00 4)  Timolol  .... As directed 5)  Nature's Code For Men Over 50  .... One packet per day 6)  Align Caps (Misc intestinal flora regulat) .... Once daily 7)  Levbid 0.375 Mg Xr12h-tab (Hyoscyamine sulfate) .... One by mouth two times a day as needed for abd. pain 8)  Diovan 80 Mg Tabs (Valsartan) .... Once daily for high blood pressure 9)  Micro-k 10 Meq Cr-caps (Potassium chloride) .... One by mouth three times a day with food  Other Orders: Gastroenterology Referral (GI)  Lipid Assessment/Plan:      Based on NCEP/ATP III, the patient's risk factor category is "history of diabetes".  The patient's lipid goals are as follows: Total cholesterol goal is 200; LDL cholesterol goal is 100; HDL cholesterol goal is 40; Triglyceride goal is 150.    Colorectal Screening:  Current Recommendations:    Colonoscopy recommended: scheduled with G.I.  Colonoscopy Results:    Date of Exam: 04/20/2004    Results: Adenomatous Polyp  PSA Screening:    PSA: 1.84  (05/01/2008)  Immunization & Chemoprophylaxis:    Tetanus vaccine: Td  (06/13/2002)    Influenza vaccine: Fluvax 3+  (05/05/2009)  Patient Instructions: 1)  Please schedule a follow-up appointment in 3 months. 2)  It is important that you exercise regularly at least 20 minutes 5 times a week. If you develop chest pain, have severe difficulty breathing, or feel very tired , stop exercising immediately and seek medical attention. 3)  You need to lose weight. Consider a lower calorie diet and regular exercise.  4)  Check your blood sugars regularly. If your readings are usually above 200  or below 70 you should contact our office. 5)  It is important that your Diabetic A1c level is checked every 3 months. 6)  See your eye  doctor yearly to check for diabetic eye  damage. 7)  Check your feet each night for sore areas, calluses or signs of infection. 8)  Check your Blood Pressure regularly. If it is above 130/80: you should make an appointment. Prescriptions: DIOVAN 80 MG TABS (VALSARTAN) once daily for high blood pressure  #30 x 11   Entered and Authorized by:   Etta Grandchild MD   Signed by:   Etta Grandchild MD on 09/14/2009   Method used:   [facility] [phone]* (retail)       [address] [place]  [address]       Ph: [phone] or [phone]       Fax: [phone]   RxID:   [id number] MICRO-K 10 MEQ CR-CAPS (POTASSIUM CHLORIDE) One by mouth three times a day with food  #90 x 11   Entered and Authorized by:   Etta Grandchild MD   Signed by:   Etta Grandchild MD on 09/14/2009   Method used:   [facility]        [facility] [phone]* (retail)       [address]       [place], [place]  [address]       Ph: [phone] or [phone]       Fax: [phone]   RxID:   [id number]

## 2010-09-08 NOTE — Procedures (Signed)
Summary: Colonoscopy  Patient: Adrian [name] Note: All result statuses are Final unless otherwise noted.  Tests: (1) Colonoscopy (COL)   COL Colonoscopy           DONE     [facility]     [address]     [place], [place]  [address]           COLONOSCOPY PROCEDURE REPORT           PATIENT:  [name], Adrian  MR#:  [id number]     BIRTHDATE:  1940-12-04, 68 yrs. old  GENDER:  male     ENDOSCOPIST:  Vania Rea. Jarold Motto, MD, [facility]     REF. BY:  Etta Grandchild, M.D.     PROCEDURE DATE:  11/10/2009     PROCEDURE:  Average-risk screening colonoscopy     G0121     ASA CLASS:  Class II     INDICATIONS:  Routine Risk Screening     MEDICATIONS:   Fentanyl 50 mcg IV, Versed 5 mg IV           DESCRIPTION OF PROCEDURE:   After the risks benefits and     alternatives of the procedure were thoroughly explained, informed     consent was obtained.  Digital rectal exam was performed and     revealed no abnormalities.   The LB [id number] endoscope     was introduced through the anus and advanced to the cecum, which     was identified by both the appendix and ileocecal valve, without     limitations.  The quality of the prep was excellent, using     MoviPrep.  The instrument was then slowly withdrawn as the colon     was fully examined.     <<PROCEDUREIMAGES>>           FINDINGS:  No polyps or cancers were seen.  This was otherwise a     normal examination of the colon.   Retroflexed views in the rectum     revealed no abnormalities.    The scope was then withdrawn from     the patient and the procedure completed.           COMPLICATIONS:  None     ENDOSCOPIC IMPRESSION:     1) No polyps or cancers     2) Otherwise normal examination     RECOMMENDATIONS:     1) Continue current colorectal screening recommendations for     "routine risk" patients with a repeat colonoscopy in 10 years.     REPEAT EXAM:  No           ______________________________     Vania Rea. Jarold Motto,  MD, Clementeen Graham           CC:           n.     eSIGNED:   Vania Rea. Sena Clouatre at 11/10/2009 11:18 AM           Mayer Masker, [id number]  Note: An exclamation mark (!) indicates a result that was not dispersed into the flowsheet. Document Creation Date: 11/10/2009 11:19 AM _______________________________________________________________________  (1) Order result status: Final Collection or observation date-time: 11/10/2009 11:14 Requested date-time:  Receipt date-time:  Reported date-time:  Referring Physician:   Ordering Physician: Sheryn Bison [id number]) Specimen Source:  Source: Launa Grill Order Number: [id number] Lab site:   Appended Document: Colonoscopy    Clinical Lists Changes  Observations: Added new observation of  COLONNXTDUE: 11/2019 (11/10/2009 14:56)

## 2010-09-08 NOTE — Letter (Signed)
Summary: Previsit letter  [facility]  [address] [place], [place] [address]   Phone: [phone]  Fax: [phone]       10/15/2009 MRN: [id number]  [facility] [address] [place], [place]  [address]  Dear Adrian [name],  Welcome to the Gastroenterology Division at [facility]    You are scheduled to see a nurse for your pre-procedure visit on 11/01/2009 at 9:00am on the 3rd floor at [facility], [address]  We ask that you try to arrive at our office 15 minutes prior to your appointment time to allow for check-in.  Your nurse visit will consist of discussing your medical and surgical history, your immediate family medical history, and your medications.    Please bring a complete list of all your medications or, if you prefer, bring the medication bottles and we will list them.  We will need to be aware of both prescribed and over the counter drugs.  We will need to know exact dosage information as well.  If you are on blood thinners (Coumadin, Plavix, Aggrenox, Ticlid, etc.) please call our office today/prior to your appointment, as we need to consult with your physician about holding your medication.   Please be prepared to read and sign documents such as consent forms, a financial agreement, and acknowledgement forms.  If necessary, and with your consent, a friend or relative is welcome to sit-in on the nurse visit with you.  Please bring your insurance card so that we may make a copy of it.  If your insurance requires a referral to see a specialist, please bring your referral form from your primary care physician.  No co-pay is required for this nurse visit.     If you cannot keep your appointment, please call [phone] to cancel or reschedule prior to your appointment date.  This allows [place] the opportunity to schedule an appointment for another patient in need of care.    Thank you for choosing [facility] for your medical  needs.  We appreciate the opportunity to care for you.  Please visit [place] at our website  to learn more about our practice.                     Sincerely.                                                                                                                   The Gastroenterology Division

## 2010-09-08 NOTE — Assessment & Plan Note (Signed)
Summary: BP IS 177/80   169/83  GOES UP AND DOWN FOR A WEEK/NWS   Vital Signs:  Patient profile:   70 year old male Height:      62.4 inches Weight:      157 pounds BMI:     28.45 O2 Sat:      95 % on Room air Temp:     97.3 degrees F oral Pulse rate:   55 / minute Pulse rhythm:   regular Resp:     16 per minute BP sitting:   156 / 88  (left arm) Cuff size:   large  Vitals Entered By: Rock Nephew CMA (January 19, 2010 11:17 AM)  Nutrition Counseling: Patient's BMI is greater than 25 and therefore counseled on weight management options.  O2 Flow:  Room air CC: discuss elevated BP readings Is Patient Diabetic? No Pain Assessment Patient in pain? no        Primary Care Provider:  Sanda Linger, MD  CC:  discuss elevated BP readings.  History of Present Illness:  Follow-Up Visit      This is a 70 year old man who presents for Follow-up visit.  The patient denies chest pain, palpitations, dizziness, syncope, edema, SOB, DOE, PND, and orthopnea.  Since the last visit the patient notes no new problems or concerns.  The patient reports taking meds as prescribed and monitoring BP.  When questioned about possible medication side effects, the patient notes none.    Preventive Screening-Counseling & Management  Alcohol-Tobacco     Alcohol drinks/day: 0     Smoking Status: quit     Passive Smoke Exposure: no  Allergies: 1)  ! Lisinopril (Lisinopril)  Past History:  Past Medical History: Last updated: 05/05/2009 Hypertension Hypercholesterolemia Colonic polyps, hx of Helicobacter pylori positive  Past Surgical History: Last updated: 11/19/2009 Inguinal herniorrhaphy left Rotator cuff repair right wrist surgery  Family History: Last updated: 11/19/2009 Family History Diabetes 1st degree relative: Mother, Brother Family History Hypertension No FH of Colon Cancer:  Social History: Last updated: 11/19/2009 Retired Alcohol use-no Drug use-no Patient is a former  smoker. -stopped 25 years ago Patient does not get regular exercise.   Risk Factors: Alcohol Use: 0 (01/19/2010) Exercise: no (11/19/2009)  Risk Factors: Smoking Status: quit (01/19/2010) Passive Smoke Exposure: no (01/19/2010)  Family History: Reviewed history from 11/19/2009 and no changes required. Family History Diabetes 1st degree relative: Mother, Brother Family History Hypertension No FH of Colon Cancer:  Social History: Reviewed history from 11/19/2009 and no changes required. Retired Alcohol use-no Drug use-no Patient is a former smoker. -stopped 25 years ago Patient does not get regular exercise.   Review of Systems       The patient complains of abdominal pain.  The patient denies anorexia, fever, weight loss, weight gain, chest pain, syncope, dyspnea on exertion, peripheral edema, prolonged cough, headaches, hemoptysis, hematuria, difficulty walking, and depression.    Physical Exam  General:  alert, well-developed, well-nourished, well-hydrated, appropriate dress, normal appearance, healthy-appearing, cooperative to examination, and good hygiene.   Eyes:  No icterus Neck:  supple, full ROM, no masses, no thyromegaly, no carotid bruits, no cervical lymphadenopathy, and no neck tenderness.   Lungs:  Normal respiratory effort, chest expands symmetrically. Lungs are clear to auscultation, no crackles or wheezes. Heart:  Normal rate and regular rhythm. S1 and S2 normal without gallop, murmur, click, rub or other extra sounds. Abdomen:  soft, non-tender, normal bowel sounds, no distention, no masses, no guarding, no  hepatomegaly, and no splenomegaly.   Msk:  normal ROM, no joint tenderness, and no joint swelling.   Pulses:  R and L carotid,radial,femoral,dorsalis pedis and posterior tibial pulses are full and equal bilaterally Extremities:  No clubbing, cyanosis, edema, or deformity noted with normal full range of motion of all joints.   Neurologic:  No cranial nerve  deficits noted. Station and gait are normal. Plantar reflexes are down-going bilaterally. DTRs are symmetrical throughout. Sensory, motor and coordinative functions appear intact. Skin:  Intact without suspicious lesions or rashes Cervical Nodes:  No lymphadenopathy noted Psych:  Cognition and judgment appear intact. Alert and cooperative with normal attention span and concentration. No apparent delusions, illusions, hallucinations  Diabetes Management Exam:    Foot Exam (with socks and/or shoes not present):       Sensory-Pinprick/Light touch:          Left medial foot (L-4): normal          Left dorsal foot (L-5): normal          Left lateral foot (S-1): normal          Right medial foot (L-4): normal          Right dorsal foot (L-5): normal          Right lateral foot (S-1): normal       Sensory-Monofilament:          Left foot: normal          Right foot: normal       Inspection:          Left foot: normal          Right foot: normal       Nails:          Left foot: normal          Right foot: normal   Impression & Recommendations:  Problem # 1:  DIABETES MELLITUS (ICD-250.00) Assessment Unchanged  The following medications were removed from the medication list:    Diovan 80 Mg Tabs (Valsartan) ..... Once daily for high blood pressure His updated medication list for this problem includes:    Losartan Potassium-hctz 100-12.5 Mg Tabs (Losartan potassium-hctz) ..... One by mouth once daily for high blood pressure  Labs Reviewed: Creat: 0.6 (01/10/2010)     Last Eye Exam: normal (12/16/2008) Reviewed HgBA1c results: 6.5 (09/14/2009)  6.3 (11/11/2008)  Problem # 2:  ESSENTIAL HYPERTENSION (ICD-401.9) Assessment: Deteriorated  The following medications were removed from the medication list:    Diovan 80 Mg Tabs (Valsartan) ..... Once daily for high blood pressure His updated medication list for this problem includes:    Losartan Potassium-hctz 100-12.5 Mg Tabs (Losartan  potassium-hctz) ..... One by mouth once daily for high blood pressure  BP today: 156/88 Prior BP: 14/70 (12/17/2009)  Prior 10 Yr Risk Heart Disease: 47 % (07/06/2009)  Labs Reviewed: K+: 3.9 (09/14/2009) Creat: : 0.6 (01/10/2010)   Chol: 192 (08/25/2008)   HDL: 40.2 (08/25/2008)   LDL: 115 (08/25/2008)   TG: 184 (08/25/2008)  Problem # 3:  ABDOMINAL PAIN-EPIGASTRIC (ICD-789.06) Assessment: Unchanged Continue to f/up with Dr. Jarold Motto  Complete Medication List: 1)  Pravachol 80 Mg Tabs (Pravastatin sodium) .... Take 1 tablet by mouth once a day 2)  Freestyle Lite Test Strp (Glucose blood) .... Test once daily dx: 250.00 3)  Freestyle Unistick Ii Lancets Misc (Lancets) .... Test once daily as directed dx: 250.00 4)  Dorzolamide Hcl-timolol Mal 22.3-6.8 Mg/ml Soln (Dorzolamide hcl-timolol mal) .Marland KitchenMarland KitchenMarland Kitchen  Apply one drop daily to right eye 5)  Nature's Code For Men Over 50  .... One packet per day 6)  Micro-k 10 Meq Cr-caps (Potassium chloride) .... One by mouth three times a day with food 7)  Vear Clock Colon Health Caps (Probiotic product) .... Two times a day 8)  Tramadol Hcl 50 Mg Tabs (Tramadol hcl) .Marland Kitchen.. 1 by mouth q 6-8 hrs as needed pain 9)  Losartan Potassium-hctz 100-12.5 Mg Tabs (Losartan potassium-hctz) .... One by mouth once daily for high blood pressure  Patient Instructions: 1)  Please schedule a follow-up appointment in 2 months. 2)  It is important that you exercise regularly at least 20 minutes 5 times a week. If you develop chest pain, have severe difficulty breathing, or feel very tired , stop exercising immediately and seek medical attention. 3)  You need to lose weight. Consider a lower calorie diet and regular exercise.  4)  Check your Blood Pressure regularly. If it is above 130/80: you should make an appointment. Prescriptions: LOSARTAN POTASSIUM-HCTZ 100-12.5 MG TABS (LOSARTAN POTASSIUM-HCTZ) One by mouth once daily for high blood pressure  #30 x 11   Entered and  Authorized by:   Etta Grandchild MD   Signed by:   Etta Grandchild MD on 01/19/2010   Method used:   Print then Give to Patient   RxID:   [id number]

## 2010-09-08 NOTE — Letter (Signed)
Summary: EGD Instructions  [facility]  [address] [place], [place] [address]   Phone: [phone]  Fax: [phone]       Adrian [name]    24-Feb-1941    MRN: [id number]       Procedure Day Dorna Bloom: Wednesday, 01/05/10     Arrival Time:  12:30     Procedure Time: 1:30     Location of Procedure:                    _X  _ [facility] (4th Floor)    PREPARATION FOR ENDOSCOPY   On 01/05/10 THE DAY OF THE PROCEDURE:  1.   No solid foods, milk or milk products are allowed after midnight the night before your procedure.  2.   Do not drink anything colored red or purple.  Avoid juices with pulp.  No orange juice.  3.  You may drink clear liquids until 11:30, which is 2 hours before your procedure.                                                                                                CLEAR LIQUIDS INCLUDE: Water Jello Ice Popsicles Tea (sugar ok, no milk/cream) Powdered fruit flavored drinks Coffee (sugar ok, no milk/cream) Gatorade Juice: apple, white grape, white cranberry  Lemonade Clear bullion, consomm, broth Carbonated beverages (any kind) Strained chicken noodle soup Hard Candy   MEDICATION INSTRUCTIONS  Unless otherwise instructed, you should take regular prescription medications with a small sip of water as early as possible the morning of your procedure.                    OTHER INSTRUCTIONS  You will need a responsible adult at least 72 years of age to accompany you and drive you home.   This person must remain in the waiting room during your procedure.  Wear loose fitting clothing that is easily removed.  Leave jewelry and other valuables at home.  However, you may wish to bring a book to read or an iPod/MP3 player to listen to music as you wait for your procedure to start.  Remove all body piercing jewelry and leave at home.  Total time from sign-in until discharge is approximately 2-3 hours.  You should  go home directly after your procedure and rest.  You can resume normal activities the day after your procedure.  The day of your procedure you should not:   Drive   Make legal decisions   Operate machinery   Drink alcohol   Return to work  You will receive specific instructions about eating, activities and medications before you leave.    The above instructions have been reviewed and explained to me by   _______________________    I fully understand and can verbalize these instructions _____________________________ Date _________

## 2010-09-08 NOTE — Procedures (Signed)
Summary: Upper Endoscopy  Patient: [name] Note: All result statuses are Final unless otherwise noted.  Tests: (1) Upper Endoscopy (EGD)   EGD Upper Endoscopy       DONE     [facility]     [address]     [place], [place]  [address]           ENDOSCOPY PROCEDURE REPORT           PATIENT:  Adrian [name], Adrian [name]  MR#:  #[id number]     BIRTHDATE:  July 25, 1941, 68 yrs. old  GENDER:  male           ENDOSCOPIST:  Vania Rea. Jarold Motto, MD, [facility]     Referred by:           PROCEDURE DATE:  01/05/2010     PROCEDURE:  EGD with biopsy     ASA CLASS:  Class I     INDICATIONS:  LUQ CHRONIC PAIN.           MEDICATIONS:   Fentanyl 25 mcg IV, Versed 2 mg IV     TOPICAL ANESTHETIC:  Exactacain Spray           DESCRIPTION OF PROCEDURE:   After the risks benefits and     alternatives of the procedure were thoroughly explained, informed     consent was obtained.  The [facility] [id number] endoscope was     introduced through the mouth and advanced to the third portion of     the duodenum, without limitations.  The instrument was slowly     withdrawn as the mucosa was fully examined.     <<PROCEDUREIMAGES>>           Normal GE junction was noted.  normal ampulla.  Normal duodenal     folds were noted.  Moderate gastritis was found in the total     stomach. CLO AND REGULAR BIOPSIES DONE.SEE PICTURES.     Retroflexed views revealed no abnormalities.    The scope was then     withdrawn from the patient and the procedure completed.           COMPLICATIONS:  None           ENDOSCOPIC IMPRESSION:     1) Normal GE junction     2) Normal ampulla     3) Normal duodenal folds     4) Moderate gastritis in the total stomach     R/O H.PYLORI AND OTHER CAUSES OF CHRONIC GASTRITIS.     RECOMMENDATIONS:     1) Avoid NSAIDS for two weeks     2) Await biopsy results     3) continue current meds           REPEAT EXAM:  No           ______________________________     Vania Rea. Jarold Motto, MD,  Clementeen Graham           CC:  Etta Grandchild, MD           n.     Rosalie DoctorMarland Kitchen   Vania Rea. Amit Meloy at 01/05/2010 01:58 PM           Vito Berger, #[id number]  Note: An exclamation mark (!) indicates a result that was not dispersed into the flowsheet. Document Creation Date: 01/11/2010 11:08 AM _______________________________________________________________________  (1) Order result status: Final Collection or observation date-time: 01/05/2010 13:52 Requested date-time:  Receipt date-time:  Reported date-time:  Referring Physician:  Ordering Physician: Sheryn Bison [phone]) Specimen Source:  Source: Launa Grill Order Number: [id number] Lab site:

## 2010-09-08 NOTE — Letter (Signed)
Summary: Patient Notice-Endo Biopsy Results  [facility]  [address] [place], [place] [address]   Phone: [phone]  Fax: [phone]        January 10, 2010 MRN: [id number]    [facility] [address] [place], [place]  [address]    Dear Mr. COFER,  I am pleased to inform you that the biopsies taken during your recent endoscopic examination did not show any evidence of cancer upon pathologic examination.  Additional information/recommendations:  __No further action is needed at this time.  Please follow-up with      your primary care physician for your other healthcare needs.  __ Please call [phone] to schedule a return visit to review      your condition.  x  Continue with the treatment plan as outlined on the day of your      exam.  __ You should have a repeat endoscopic examination for this problem              in _ months/years.   Please call us if you are having persistent problems or have questions about your condition that have not been fully answered at this time.  Sincerely,  Mardella Layman MD [facility]  This letter has been electronically signed by your physician.  Appended Document: Patient Notice-Endo Biopsy Results letter mailed.

## 2010-09-08 NOTE — Assessment & Plan Note (Signed)
Summary: MEDICINES ARE NOT WORKING/YF   History of Present Illness Visit Type: Follow-up Visit Primary GI MD: Sheryn Bison MD FACP FAGA Primary Provider: Sanda Linger, MD Chief Complaint: follow up abdominal pain, pt states meds are not working  History of Present Illness:   The Patient is a 70 year old Hispanic male who's had chronic abdominal pain for many years of unexplained etiology. Recent laboratory testing was unremarkable as was upper abdominal ultrasound exam although his pancreas is poorly visualized because of overlying bowel gas. He continues to have a chronic 3/10 epigastric mid abdominal pain with occasional exacerbations, but no known persistent dictating or alleviating elements. Trial of probiotics and t.i.d. Librax has not been successful. He denies the history of known pancreatitis, hepatitis, or other systemic complaints or hepatobiliary symptoms.  He denies abuse of alcohol, cigarettes, or NSAIDs. Recent colonoscopy was unremarkable. He does have non-insulin-dependent diabetes. He has not had complications from his diabetes, and denies a history of peripheral vascular disease or coronary artery disease.   GI Review of Systems    Reports abdominal pain.     Location of  Abdominal pain: generalized.    Denies acid reflux, belching, bloating, chest pain, dysphagia with liquids, dysphagia with solids, heartburn, loss of appetite, nausea, vomiting, vomiting blood, weight loss, and  weight gain.        Denies anal fissure, black tarry stools, change in bowel habit, constipation, diarrhea, diverticulosis, fecal incontinence, heme positive stool, hemorrhoids, irritable bowel syndrome, jaundice, light color stool, liver problems, rectal bleeding, and  rectal pain.    Current Medications (verified): 1)  Pravachol 80 Mg Tabs (Pravastatin Sodium) .... Take 1 Tablet By Mouth Once A Day 2)  Freestyle Lite Test  Strp (Glucose Blood) .... Test Once Daily Dx: 250.00 3)  Freestyle  Unistick Ii Lancets  Misc (Lancets) .... Test Once Daily As Directed Dx: 250.00 4)  Dorzolamide Hcl-Timolol Mal 22.3-6.8 Mg/ml Soln (Dorzolamide Hcl-Timolol Mal) .... Apply One Drop Daily To Right Eye 5)  Nature's Code For Men Over 50 .... One Packet Per Day 6)  Diovan 80 Mg Tabs (Valsartan) .... Once Daily For High Blood Pressure 7)  Micro-K 10 Meq Cr-Caps (Potassium Chloride) .... One By Mouth Three Times A Day With Food 8)  Phillips Colon Health  Caps (Probiotic Product) .... Two Times A Day 9)  Clidinium-Chlordiazepoxide 2.5-5 Mg Caps (Clidinium-Chlordiazepoxide) .Marland Kitchen.. 1 By Mouth Three Times A Day Prn  Allergies (verified): 1)  ! Lisinopril (Lisinopril)  Past History:  Past medical, surgical, family and social histories (including risk factors) reviewed for relevance to current acute and chronic problems.  Past Medical History: Reviewed history from 05/05/2009 and no changes required. Hypertension Hypercholesterolemia Colonic polyps, hx of Helicobacter pylori positive  Past Surgical History: Reviewed history from 11/19/2009 and no changes required. Inguinal herniorrhaphy left Rotator cuff repair right wrist surgery  Family History: Reviewed history from 11/19/2009 and no changes required. Family History Diabetes 1st degree relative: Mother, Brother Family History Hypertension No FH of Colon Cancer:  Social History: Reviewed history from 11/19/2009 and no changes required. Retired Alcohol use-no Drug use-no Patient is a former smoker. -stopped 25 years ago Patient does not get regular exercise.   Review of Systems  The patient denies allergy/sinus, anemia, anxiety-new, arthritis/joint pain, back pain, blood in urine, breast changes/lumps, confusion, cough, coughing up blood, depression-new, fainting, fatigue, fever, headaches-new, hearing problems, heart murmur, heart rhythm changes, itching, muscle pains/cramps, night sweats, nosebleeds, shortness of breath, skin rash,  sleeping problems, sore throat,  swelling of feet/legs, swollen lymph glands, thirst - excessive, urination - excessive, urination changes/pain, urine leakage, vision changes, and voice change.    Vital Signs:  Patient profile:   70 year old male Height:      62.4 inches Weight:      161 pounds BMI:     29.18 Pulse rate:   64 / minute Pulse rhythm:   regular BP sitting:   14 / 70  (left arm) Cuff size:   regular  Vitals Entered By: Francee Piccolo CMA Duncan Dull) (Dec 17, 2009 11:00 AM)  Physical Exam  General:  Well developed, well nourished, no acute distress.healthy appearing.   Head:  Normocephalic and atraumatic. Eyes:  PERRLA, no icterus.exam deferred to patient's ophthalmologist.   Abdomen:  Soft, nontender and nondistended. No masses, hepatosplenomegaly or hernias noted. Normal bowel sounds. Psych:  Alert and cooperative. Normal mood and affect.   Impression & Recommendations:  Problem # 1:  ABDOMINAL PAIN-EPIGASTRIC (ICD-789.06) Assessment Unchanged I have scheduled endoscopy exam was surprisingly has not been completed in the past. Recent serology for Helicobacter was negative. Ultrasound was also normal but pancreas was poorly seen. His endoscopy is unremarkable we'll proceed with abdominal-pelvic CT scan. I stopped his Librax and prescribe p.r.n. tramadol 50 mg every 6-8 hours pending complete evaluation.  Problem # 2:  IRRITABLE BOWEL SYNDROME (ICD-564.1) Assessment: Comment Only  Problem # 3:  COLONIC POLYPS, HX OF (ICD-V12.72) Assessment: Unchanged Followup as per clinical protocol. He will continue fiber supplements and probiotic therapy.  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 As mentioned above we will discontinue Librax therapy which does have some anticholinergic effect.  Patient Instructions: 1)  Stop Librax. 2)  A prescription will be sent to your pharmacy for  tramaldol to use as needed for pain. 3)  You are scheduled for an upper endoscopy. 4)  The medication list was reviewed and reconciled.  All changed / newly prescribed medications were explained.  A complete medication list was provided to the patient / caregiver. 5)  Copy sent to : Dr. Sanda Linger 6)  Conscious Sedation brochure given.  7)  Upper Endoscopy brochure given.   Appended Document: MEDICINES ARE NOT WORKING/YF    Clinical Lists Changes  Medications: Removed medication of CLIDINIUM-CHLORDIAZEPOXIDE 2.5-5 MG CAPS (CLIDINIUM-CHLORDIAZEPOXIDE) 1 by mouth three times a day prn Added new medication of TRAMADOL HCL 50 MG TABS (TRAMADOL HCL) 1 by mouth q 6-8 hrs as needed pain - Signed Rx of TRAMADOL HCL 50 MG TABS (TRAMADOL HCL) 1 by mouth q 6-8 hrs as needed pain;  #30 x 3;  Signed;  Entered by: Ashok Cordia RN;  Authorized by: Mardella Layman MD [facility];  Method used: Electronically to [facility]. [phone]*, [address] [place], [place]  [address], Ph: [phone] or [phone], Fax: [phone] Orders: Added new Test order of EGD (EGD) - Signed    Prescriptions: TRAMADOL HCL 50 MG TABS (TRAMADOL HCL) 1 by mouth q 6-8 hrs as needed pain  #30 x 3   Entered by:   Ashok Cordia RN   Authorized by:   Mardella Layman MD [facility]   Signed by:   Ashok Cordia RN on 12/17/2009   Method used:   [facility]ronically to        [facility]. [phone]* (retail)       [address]       [place], [place]  [id number]       Ph: [phone] or [phone]       Fax: [phone]   RxID:   [id number]    Appended Document: MEDICINES ARE NOT [facility]    Clinical Lists Changes  Orders: Added new Referral order of CT Abdomen/Pelvis with Contrast (CT Abd/Pelvis w/con) - Signed

## 2010-09-08 NOTE — Letter (Signed)
Summary: Results Follow-up Letter  [facility]  [address] [place], [place] [address]   Phone: [phone]  Fax: [phone]    05/18/2010  [address] [place], [place]  [address]  Dear Adrian [name],   The following are the results of your recent test(s):  Test     Result     Testosterone   normal Prostate     normal Thyroid     normal CBC       normal Liver/kidney   normal Blood sugars   normal  _________________________________________________________  Please call for an appointment as directed _________________________________________________________ _________________________________________________________ _________________________________________________________  Sincerely,  Sanda Linger MD [facility]

## 2010-10-24 ENCOUNTER — Other Ambulatory Visit: Payer: Self-pay | Admitting: Internal Medicine

## 2010-10-24 ENCOUNTER — Ambulatory Visit (INDEPENDENT_AMBULATORY_CARE_PROVIDER_SITE_OTHER): Payer: Medicare Other | Admitting: Internal Medicine

## 2010-10-24 ENCOUNTER — Ambulatory Visit (INDEPENDENT_AMBULATORY_CARE_PROVIDER_SITE_OTHER)
Admission: RE | Admit: 2010-10-24 | Discharge: 2010-10-24 | Disposition: A | Discharge status: Home / Self Care | Payer: Medicare Other | Source: Ambulatory Visit | Attending: Internal Medicine | Admitting: Internal Medicine

## 2010-10-24 ENCOUNTER — Encounter: Payer: Self-pay | Admitting: Internal Medicine

## 2010-10-24 DIAGNOSIS — J209 Acute bronchitis, unspecified: Secondary | ICD-10-CM

## 2010-10-24 DIAGNOSIS — R05 Cough: Secondary | ICD-10-CM

## 2010-10-24 DIAGNOSIS — E119 Type 2 diabetes mellitus without complications: Secondary | ICD-10-CM

## 2010-10-24 DIAGNOSIS — I10 Essential (primary) hypertension: Secondary | ICD-10-CM

## 2010-10-28 ENCOUNTER — Telehealth: Payer: Self-pay | Admitting: *Deleted

## 2010-10-28 NOTE — Telephone Encounter (Signed)
No changes for now.

## 2010-10-28 NOTE — Telephone Encounter (Signed)
Wife left vm - not feeling any better. Had fever last night. Does pt need alt rx? Started avelox 400mg  on Monday.  Please advise.

## 2010-10-28 NOTE — Telephone Encounter (Signed)
Pt's fiance Karena Addison informed.

## 2010-10-31 ENCOUNTER — Encounter: Payer: Self-pay | Admitting: Internal Medicine

## 2010-11-01 ENCOUNTER — Encounter: Payer: Self-pay | Admitting: Internal Medicine

## 2010-11-01 ENCOUNTER — Ambulatory Visit (INDEPENDENT_AMBULATORY_CARE_PROVIDER_SITE_OTHER): Payer: Medicare Other | Admitting: Internal Medicine

## 2010-11-01 VITALS — BP 118/70 | HR 56 | Temp 98.4°F | Ht 62.4 in | Wt 154.8 lb

## 2010-11-01 DIAGNOSIS — R059 Cough, unspecified: Secondary | ICD-10-CM | POA: Insufficient documentation

## 2010-11-01 DIAGNOSIS — R05 Cough: Secondary | ICD-10-CM

## 2010-11-01 HISTORY — DX: Cough, unspecified: R05.9

## 2010-11-01 MED ORDER — AZITHROMYCIN 250 MG PO TABS: ORAL_TABLET | ORAL | Status: AC

## 2010-11-01 MED ORDER — HYDROCODONE-HOMATROPINE 5-1.5 MG/5ML PO SYRP: 5.0000 mL | ORAL_SOLUTION | Freq: Four times a day (QID) | ORAL | Status: AC | PRN

## 2010-11-01 MED ORDER — OMEPRAZOLE 20 MG PO CPDR: 20.0000 mg | DELAYED_RELEASE_CAPSULE | Freq: Two times a day (BID) | ORAL | Status: AC

## 2010-11-01 MED ORDER — PREDNISONE (PAK) 10 MG PO TABS: 10.0000 mg | ORAL_TABLET | ORAL | Status: AC

## 2010-11-01 NOTE — Assessment & Plan Note (Signed)
status post avelox and guaif/cod syrup without improvement - cxr 10/24/10 reviewed - nad Re-treat Zapk given cont fever, pred pak for bronchial inflammation, PPI qd x 2 weeks and hydromet - rx done

## 2010-11-01 NOTE — Progress Notes (Signed)
  Subjective:    Patient ID: Adrian [name], male    DOB: Aug 25, 1940, 70 y.o.   MRN: [id number]  HPI  Here for followup cough, ?pneumonia -  Seen 10/24/10 for same - given 7d avelox and guaif/cod - not improved Continued dry cough spasma and fever - no SOB, CP or hemopytsis No travel, no other med changes  Past Medical History  Diagnosis Date  . DIABETES MELLITUS 11/11/2008  . HYPERCHOLESTEROLEMIA 05/01/2008  . ERECTILE DYSFUNCTION 05/18/2010  . 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 ESSENTIAL HYPERTENSION 05/01/2008  . GASTRITIS 01/05/2010  . RENAL CYST 09/07/2008  . Helicobacter pylori (H. pylori)   . Hx of colonic polyps      Review of Systems  Respiratory: Negative for cough, shortness of breath and stridor.   Cardiovascular: Negative for chest pain.  Musculoskeletal: Negative for back pain.  Neurological: Negative for syncope and headaches.       Objective:   Physical Exam  Constitutional: He is oriented to person, place, and time. He appears well-developed and well-nourished.  HENT:  Head: Normocephalic and atraumatic.  Right Ear: External ear normal.  Left Ear: External ear normal.  Nose: Nose normal.  Mouth/Throat: Oropharynx is clear and moist. No oropharyngeal exudate.  Eyes: Conjunctivae and EOM are normal. Pupils are equal, round, and reactive to light.  Neck: Normal range of motion. Neck supple.  Cardiovascular: Normal rate, regular rhythm and normal heart sounds.  Exam reveals no friction rub.   No murmur heard. Pulmonary/Chest: Effort normal and breath sounds normal. No respiratory distress. He has no wheezes. He exhibits no tenderness.  Abdominal: Soft. Bowel sounds are normal.  Lymphadenopathy:    He has no cervical adenopathy.  Neurological: He is alert and oriented to person, place, and time. Coordination normal.  Psychiatric: He has a normal mood and affect. His behavior is normal. Judgment and thought content normal.   CXR 10/24/10 reviewed -  NAD Lab Results  Component Value Date   WBC 5.1 05/18/2010   HGB 15.5 05/18/2010   HCT 44.5 05/18/2010   PLT 199.0 05/18/2010   CHOL 192 08/25/2008   TRIG 184* 08/25/2008   HDL 40.2 08/25/2008   ALT 26 05/18/2010   AST 19 05/18/2010   NA 141 05/18/2010   K 4.1 05/18/2010   CL 106 05/18/2010   CREATININE 0.7 05/18/2010   BUN 18 05/18/2010   CO2 31 05/18/2010   TSH 2.98 05/18/2010   PSA 1.68 05/18/2010   HGBA1C 6.6* 05/18/2010   MICROALBUR 0.9 09/14/2009       Assessment & Plan:  See problem list. Medications and labs reviewed today.

## 2010-11-01 NOTE — Patient Instructions (Signed)
It was good to see you today. Zpak, Pred pak and omeprazole as discussed - also refill on cough syrup Your prescription(s) have been given to you or submitted to your pharmacy. Please take as directed and contact our office if you believe you are having problem(s) with the medication(s).

## 2010-11-03 NOTE — Assessment & Plan Note (Signed)
Summary: knot in throat/fever/cough/cd   Vital Signs:  Patient profile:   70 year old male Height:      62.4 inches (158.50 cm) Weight:      158 pounds (71.82 kg) BMI:     28.63 O2 Sat:      94 % on Room air Temp:     99.7 degrees F (37.61 degrees C) oral Pulse rate:   82 / minute Pulse rhythm:   regular Resp:     16 per minute BP sitting:   130 / 72  (left arm) Cuff size:   regular  Vitals Entered By: Burnard Leigh CMA(AAMA) (October 24, 2010 3:16 PM)  Nutrition Counseling: Patient's BMI is greater than 25 and therefore counseled on weight management options.  O2 Flow:  Room air CC: Pt c/o cough w/congestion, mucus of green color, sore throat w/swollen lymph node, wheezing, fever between 100-102 x3 days/sls, cma, URI symptoms, Hypertension Management Is Patient Diabetic? Yes Did you bring your meter with you today? No Pain Assessment Patient in pain? no        Primary Care Provider:  Sanda Linger, MD  CC:  Pt c/o cough w/congestion, mucus of green color, sore throat w/swollen lymph node, wheezing, fever between 100-102 x3 days/sls, cma, URI symptoms, and Hypertension Management.  History of Present Illness:  URI Symptoms      This is a 70 year old man who presents with URI symptoms.  The symptoms began 3 days ago.  The severity is described as moderate.  The patient reports nasal congestion, purulent nasal discharge, sore throat, and productive cough, but denies dry cough and earache.  Associated symptoms include fever of 100.5-103 degrees.  The patient denies stiff neck, dyspnea, wheezing, rash, vomiting, diarrhea, use of an antipyretic, and response to antipyretic.  The patient also reports headache, muscle aches, and severe fatigue.  The patient denies itchy throat, sneezing, and seasonal symptoms.  The patient denies the following risk factors for Strep sinusitis: unilateral facial pain, unilateral nasal discharge, poor response to decongestant, double sickening, tooth pain,  Strep exposure, tender adenopathy, and absence of cough.    Hypertension History:      He denies headache, chest pain, palpitations, dyspnea with exertion, orthopnea, PND, peripheral edema, visual symptoms, neurologic problems, syncope, and side effects from treatment.  He notes no problems with any antihypertensive medication side effects.        Positive major cardiovascular risk factors include male age 72 years old or older, diabetes, hyperlipidemia, and hypertension.  Negative major cardiovascular risk factors include negative family history for ischemic heart disease and non-tobacco-user status.        Further assessment for target organ damage reveals no history of ASHD, cardiac end-organ damage (CHF/LVH), stroke/TIA, peripheral vascular disease, renal insufficiency, or hypertensive retinopathy.    Preventive Screening-Counseling & Management  Alcohol-Tobacco     Alcohol drinks/day: 0     Alcohol Counseling: not indicated; patient does not drink     Smoking Status: quit     Passive Smoke Exposure: no     Tobacco Counseling: to remain off tobacco products  Hep-HIV-STD-Contraception     Hepatitis Risk: no risk noted     HIV Risk: no risk noted     STD Risk: no risk noted      Sexual History:  currently monogamous.        Drug Use:  never.        Blood Transfusions:  no.    Clinical Review Panels:  Prevention   Last Colonoscopy:  DONE (11/10/2009)   Last PSA:  1.68 (05/18/2010)  Immunizations   Last Tetanus Booster:  Td (06/13/2002)   Last Flu Vaccine:  Fluvax 3+ (05/18/2010)  Lipid Management   Cholesterol:  192 (08/25/2008)   LDL (bad choesterol):  115 (08/25/2008)   HDL (good cholesterol):  40.2 (08/25/2008)  Diabetes Management   HgBA1C:  6.6 (05/18/2010)   Creatinine:  0.7 (05/18/2010)   Last Dilated Eye Exam:  normal (05/10/2010)   Last Foot Exam:  yes (06/14/2010)   Last Flu Vaccine:  Fluvax 3+ (05/18/2010)  CBC   WBC:  5.1 (05/18/2010)   RBC:  4.76  (05/18/2010)   Hgb:  15.5 (05/18/2010)   Hct:  44.5 (05/18/2010)   Platelets:  199.0 (05/18/2010)   MCV  93.5 (05/18/2010)   MCHC  34.9 (05/18/2010)   RDW  13.5 (05/18/2010)   PMN:  54.1 (05/18/2010)   Lymphs:  30.6 (05/18/2010)   Monos:  11.9 (05/18/2010)   Eosinophils:  2.8 (05/18/2010)   Basophil:  0.6 (05/18/2010)  Complete Metabolic Panel   Glucose:  127 (05/18/2010)   Sodium:  141 (05/18/2010)   Potassium:  4.1 (05/18/2010)   Chloride:  106 (05/18/2010)   CO2:  31 (05/18/2010)   BUN:  18 (05/18/2010)   Creatinine:  0.7 (05/18/2010)   Albumin:  4.0 (05/18/2010)   Total Protein:  6.7 (05/18/2010)   Calcium:  9.2 (05/18/2010)   Total Bili:  1.1 (05/18/2010)   Alk Phos:  54 (05/18/2010)   SGPT (ALT):  26 (05/18/2010)   SGOT (AST):  19 (05/18/2010)   Medications Prior to Update: 1)  Pravachol 80 Mg Tabs (Pravastatin Sodium) .... Take 1 Tablet By Mouth Once A Day 2)  Freestyle Lite Test  Strp (Glucose Blood) .... Test Once Daily Dx: 250.00 3)  Freestyle Unistick Ii Lancets  Misc (Lancets) .... Test Once Daily As Directed Dx: 250.00 4)  Dorzolamide Hcl-Timolol Mal 22.3-6.8 Mg/ml Soln (Dorzolamide Hcl-Timolol Mal) .... Apply One Drop Daily To Right Eye 5)  Tramadol Hcl 50 Mg Tabs (Tramadol Hcl) .Marland Kitchen.. 1 By Mouth Q 6-8 Hrs As Needed Pain 6)  Losartan Potassium-Hctz 100-12.5 Mg Tabs (Losartan Potassium-Hctz) .... One By Mouth Once Daily For High Blood Pressure 7)  Cialis 20 Mg Tabs (Tadalafil) .... Take As Directed  Current Medications (verified): 1)  Pravachol 80 Mg Tabs (Pravastatin Sodium) .... Take 1 Tablet By Mouth Once A Day 2)  Freestyle Lite Test  Strp (Glucose Blood) .... Test Once Daily Dx: 250.00 3)  Freestyle Unistick Ii Lancets  Misc (Lancets) .... Test Once Daily As Directed Dx: 250.00 4)  Dorzolamide Hcl-Timolol Mal 22.3-6.8 Mg/ml Soln (Dorzolamide Hcl-Timolol Mal) .... Apply One Drop Daily To Right Eye 5)  Tramadol Hcl 50 Mg Tabs (Tramadol Hcl) .Marland Kitchen.. 1 By Mouth Q  6-8 Hrs As Needed Pain 6)  Losartan Potassium-Hctz 100-12.5 Mg Tabs (Losartan Potassium-Hctz) .... One By Mouth Once Daily For High Blood Pressure 7)  Cialis 20 Mg Tabs (Tadalafil) .... Take As Directed 8)  Avelox 400 Mg Tabs (Moxifloxacin Hcl) .... One By Mouth Once Daily For 7 Days 9)  Guiatuss Ac 100-10 Mg/34ml Syrp (Guaifenesin-Codeine) .... 5-10 Ml By Mouth Qid As Needed For Cough 10)  Lidocaine Viscous 2 % Soln (Lidocaine Hcl) .... Swish and Spit 5 Ml By Mouth Qid As Needed For Sore Throat  Allergies (verified): 1)  ! Lisinopril (Lisinopril)  Past History:  Past Medical History: Last updated: 05/05/2009 Hypertension Hypercholesterolemia Colonic polyps, hx  of Helicobacter pylori positive  Past Surgical History: Last updated: 01/24/2010 Inguinal herniorrhaphy left Rotator cuff repair right wrist surgery eye surgery right-glaucoma (to relieve pressure)  Family History: Last updated: 11/19/2009 Family History Diabetes 1st degree relative: Mother, Brother Family History Hypertension No FH of Colon Cancer:  Social History: Last updated: 11/19/2009 Retired Alcohol use-no Drug use-no Patient is a former smoker. -stopped 25 years ago Patient does not get regular exercise.   Risk Factors: Alcohol Use: 0 (10/24/2010) Exercise: no (11/19/2009)  Risk Factors: Smoking Status: quit (10/24/2010) Passive Smoke Exposure: no (10/24/2010)  Family History: Reviewed history from 11/19/2009 and no changes required. Family History Diabetes 1st degree relative: Mother, Brother Family History Hypertension No FH of Colon Cancer:  Social History: Reviewed history from 11/19/2009 and no changes required. Retired Alcohol use-no Drug use-no Patient is a former smoker. -stopped 25 years ago Patient does not get regular exercise.   Review of Systems       The patient complains of fever and hoarseness.  The patient denies anorexia, weight loss, weight gain, decreased hearing, chest  pain, syncope, dyspnea on exertion, peripheral edema, headaches, hemoptysis, abdominal pain, hematuria, suspicious skin lesions, transient blindness, difficulty walking, and enlarged lymph nodes.    Physical Exam  General:  alert, well-developed, well-nourished, well-hydrated, appropriate dress, normal appearance, cooperative to examination, and good hygiene.   Head:  normocephalic, atraumatic, no abnormalities observed, and no abnormalities palpated.   Eyes:  vision grossly intact and pupils equal.   Ears:  R ear normal and no external deformities.   Nose:  External nasal examination shows no deformity or inflammation. Nasal mucosa are pink and moist without lesions or exudates. Mouth:  good dentition, no exudates, no posterior lymphoid hypertrophy, no postnasal drip, no pharyngeal crowing, no lesions, no aphthous ulcers, no erosions, no tongue abnormalities, no leukoplakia, no petechiae, and pharyngeal erythema.   Neck:  supple, full ROM, no masses, no thyromegaly, no carotid bruits, no cervical lymphadenopathy, and no neck tenderness.   Lungs:  Normal respiratory effort, chest expands symmetrically. Lungs are clear to auscultation, no crackles or wheezes. Heart:  Normal rate and regular rhythm. S1 and S2 normal without gallop, murmur, click, rub or other extra sounds. Abdomen:  soft, non-tender, normal bowel sounds, no distention, no masses, no guarding, no hepatomegaly, and no splenomegaly.   Msk:  normal ROM, no joint tenderness, and no joint swelling.   Pulses:  R and L carotid,radial,femoral,dorsalis pedis and posterior tibial pulses are full and equal bilaterally Extremities:  No clubbing, cyanosis, edema, or deformity noted with normal full range of motion of all joints.   Neurologic:  No cranial nerve deficits noted. Station and gait are normal. Plantar reflexes are down-going bilaterally. DTRs are symmetrical throughout. Sensory, motor and coordinative functions appear intact. Skin:   Intact without suspicious lesions or rashes Cervical Nodes:  no anterior cervical adenopathy and no posterior cervical adenopathy.   Axillary Nodes:  no R axillary adenopathy and no L axillary adenopathy.   Psych:  Cognition and judgment appear intact. Alert and cooperative with normal attention span and concentration. No apparent delusions, illusions, hallucinations   Impression & Recommendations:  Problem # 1:  COUGH (ICD-786.2) Assessment New  Orders: T-2 View CXR (71020TC)  Problem # 2:  BRONCHITIS-ACUTE (ICD-466.0) Assessment: New  His updated medication list for this problem includes:    Avelox 400 Mg Tabs (Moxifloxacin hcl) ..... One by mouth once daily for 7 days    Guiatuss Ac 100-10 Mg/7ml Syrp (Guaifenesin-codeine) .Marland KitchenMarland KitchenMarland KitchenMarland Kitchen  5-10 ml by mouth qid as needed for cough  Take antibiotics and other medications as directed. Encouraged to push clear liquids, get enough rest, and take acetaminophen as needed. To be seen in 5-7 days if no improvement, sooner if worse.  Problem # 3:  DIABETES MELLITUS (ICD-250.00) Assessment: Unchanged  His updated medication list for this problem includes:    Losartan Potassium-hctz 100-12.5 Mg Tabs (Losartan potassium-hctz) ..... One by mouth once daily for high blood pressure  Labs Reviewed: Creat: 0.7 (05/18/2010)     Last Eye Exam: normal (05/10/2010) Reviewed HgBA1c results: 6.6 (05/18/2010)  6.5 (09/14/2009)  Problem # 4:  ESSENTIAL HYPERTENSION (ICD-401.9) Assessment: Improved  His updated medication list for this problem includes:    Losartan Potassium-hctz 100-12.5 Mg Tabs (Losartan potassium-hctz) ..... One by mouth once daily for high blood pressure  BP today: 130/72 Prior BP: 110/60 (06/14/2010)  Prior 10 Yr Risk Heart Disease: 33 % (06/14/2010)  Labs Reviewed: K+: 4.1 (05/18/2010) Creat: : 0.7 (05/18/2010)   Chol: 192 (08/25/2008)   HDL: 40.2 (08/25/2008)   LDL: 115 (08/25/2008)   TG: 184 (08/25/2008)  Complete Medication  List: 1)  Pravachol 80 Mg Tabs (Pravastatin sodium) .... Take 1 tablet by mouth once a day 2)  Freestyle Lite Test Strp (Glucose blood) .... Test once daily dx: 250.00 3)  Freestyle Unistick Ii Lancets Misc (Lancets) .... Test once daily as directed dx: 250.00 4)  Dorzolamide Hcl-timolol Mal 22.3-6.8 Mg/ml Soln (Dorzolamide hcl-timolol mal) .... Apply one drop daily to right eye 5)  Tramadol Hcl 50 Mg Tabs (Tramadol hcl) .Marland Kitchen.. 1 by mouth q 6-8 hrs as needed pain 6)  Losartan Potassium-hctz 100-12.5 Mg Tabs (Losartan potassium-hctz) .... One by mouth once daily for high blood pressure 7)  Cialis 20 Mg Tabs (Tadalafil) .... Take as directed 8)  Avelox 400 Mg Tabs (Moxifloxacin hcl) .... One by mouth once daily for 7 days 9)  Guiatuss Ac 100-10 Mg/49ml Syrp (Guaifenesin-codeine) .... 5-10 ml by mouth qid as needed for cough 10)  Lidocaine Viscous 2 % Soln (Lidocaine hcl) .... Swish and spit 5 ml by mouth qid as needed for sore throat  Hypertension Assessment/Plan:      The patient's hypertensive risk group is category C: Target organ damage and/or diabetes.  His calculated 10 year risk of coronary heart disease is 40 %.  Today's blood pressure is 130/72.  His blood pressure goal is < 130/80.   Patient Instructions: 1)  Please schedule a follow-up appointment in 2 weeks. 2)  Take your antibiotic as prescribed until ALL of it is gone, but stop if you develop a rash or swelling and contact our office as soon as possible. 3)  Acute bronchitis symptoms for less than 10 days are not helped by antibiotics. take over the counter cough medications. call if no improvment in  5-7 days, sooner if increasing cough, fever, or new symptoms( shortness of breath, chest pain). Prescriptions: LIDOCAINE VISCOUS 2 % SOLN (LIDOCAINE HCL) swish and spit 5 ml by mouth QID as needed for sore throat  #4 ounces x 0   Entered and Authorized by:   Etta Grandchild MD   Signed by:   Etta Grandchild MD on 10/24/2010   Method  used:   Electronically to        [facility]. [phone]* (retail)       [address]       [place], [place]  [address]  Ph: [phone] or [phone]       Fax: [phone]   RxID:   [id number] GUIATUSS AC 100-10 MG/5ML SYRP (GUAIFENESIN-CODEINE) 5-10 ml by mouth QID as needed for cough  #8 ounces x 0   Entered and Authorized by:   Etta Grandchild MD   Signed by:   Etta Grandchild MD on 10/24/2010   Method used:   Print then Give to Patient   RxID:   [id number] AVELOX 400 MG TABS (MOXIFLOXACIN HCL) One by mouth once daily for 7 days  #7 x 0   Entered and Authorized by:   Etta Grandchild MD   Signed by:   Etta Grandchild MD on 10/24/2010   Method used:   Samples Given   RxID:   [id number]    Orders Added: 1)  T-2 View CXR [71020TC] 2)  Est. Patient Level IV [73419]

## 2010-11-07 ENCOUNTER — Ambulatory Visit: Payer: Medicare Other | Admitting: Internal Medicine

## 2010-11-07 LAB — HM DIABETES EYE EXAM

## 2010-11-10 ENCOUNTER — Other Ambulatory Visit (INDEPENDENT_AMBULATORY_CARE_PROVIDER_SITE_OTHER): Payer: Medicare Other | Admitting: Internal Medicine

## 2010-11-10 ENCOUNTER — Other Ambulatory Visit (INDEPENDENT_AMBULATORY_CARE_PROVIDER_SITE_OTHER): Payer: Medicare Other

## 2010-11-10 ENCOUNTER — Encounter: Payer: Self-pay | Admitting: Internal Medicine

## 2010-11-10 ENCOUNTER — Ambulatory Visit (INDEPENDENT_AMBULATORY_CARE_PROVIDER_SITE_OTHER): Payer: Medicare Other | Admitting: Internal Medicine

## 2010-11-10 VITALS — BP 98/52 | HR 50 | Temp 97.5°F | Ht 62.0 in | Wt 155.0 lb

## 2010-11-10 DIAGNOSIS — J209 Acute bronchitis, unspecified: Secondary | ICD-10-CM

## 2010-11-10 DIAGNOSIS — E119 Type 2 diabetes mellitus without complications: Secondary | ICD-10-CM

## 2010-11-10 DIAGNOSIS — E7800 Pure hypercholesterolemia, unspecified: Secondary | ICD-10-CM

## 2010-11-10 DIAGNOSIS — I10 Essential (primary) hypertension: Secondary | ICD-10-CM

## 2010-11-10 DIAGNOSIS — E785 Hyperlipidemia, unspecified: Secondary | ICD-10-CM

## 2010-11-10 HISTORY — DX: Acute bronchitis, unspecified: J20.9

## 2010-11-10 LAB — CBC WITH DIFFERENTIAL/PLATELET
Basophils Absolute: 0.1 10*3/uL (ref 0.0–0.1)
Basophils Relative: 0.9 % (ref 0.0–3.0)
HCT: 41.1 % (ref 39.0–52.0)
Hemoglobin: 14.3 g/dL (ref 13.0–17.0)
Lymphocytes Relative: 26.9 % (ref 12.0–46.0)
Lymphs Abs: 1.8 10*3/uL (ref 0.7–4.0)
Monocytes Relative: 11.6 % (ref 3.0–12.0)
Neutro Abs: 4 10*3/uL (ref 1.4–7.7)
RBC: 4.42 Mil/uL (ref 4.22–5.81)
RDW: 13.1 % (ref 11.5–14.6)

## 2010-11-10 LAB — COMPREHENSIVE METABOLIC PANEL
AST: 16 U/L (ref 0–37)
Albumin: 3.3 g/dL — ABNORMAL LOW (ref 3.5–5.2)
BUN: 26 mg/dL — ABNORMAL HIGH (ref 6–23)
CO2: 32 mEq/L (ref 19–32)
Calcium: 8.9 mg/dL (ref 8.4–10.5)
Chloride: 102 mEq/L (ref 96–112)
Creatinine, Ser: 0.9 mg/dL (ref 0.4–1.5)
GFR: 88.22 mL/min (ref >60.00)
Glucose, Bld: 253 mg/dL — ABNORMAL HIGH (ref 70–99)
Potassium: 3.8 mEq/L (ref 3.5–5.1)

## 2010-11-10 LAB — LIPID PANEL
Cholesterol: 189 mg/dL (ref 0–200)
HDL: 40.5 mg/dL
Total CHOL/HDL Ratio: 5
Triglycerides: 240 mg/dL — ABNORMAL HIGH (ref 0.0–149.0)
VLDL: 48 mg/dL — ABNORMAL HIGH (ref 0.0–40.0)

## 2010-11-10 LAB — TSH: TSH: 2.23 u[IU]/mL (ref 0.35–5.50)

## 2010-11-10 LAB — LDL CHOLESTEROL, DIRECT: Direct LDL: 116.6 mg/dL

## 2010-11-10 LAB — HEMOGLOBIN A1C: Hgb A1c MFr Bld: 8.1 % — ABNORMAL HIGH (ref 4.6–6.5)

## 2010-11-10 MED ORDER — CEFUROXIME AXETIL 500 MG PO TABS: 500.0000 mg | ORAL_TABLET | Freq: Two times a day (BID) | ORAL | Status: AC

## 2010-11-10 MED ORDER — METHYLPREDNISOLONE ACETATE 80 MG/ML IJ SUSP
120.0000 mg | Freq: Once | INTRAMUSCULAR | Status: AC
  Administered 2010-11-10: 120 mg via INTRAMUSCULAR

## 2010-11-10 NOTE — Assessment & Plan Note (Signed)
He is doing well on pravachol, I will check his labs today

## 2010-11-10 NOTE — Assessment & Plan Note (Signed)
I will recheck his A1C today and see how his lifestyle modifications are working

## 2010-11-10 NOTE — Assessment & Plan Note (Signed)
He got a depo-medrol shot in the event that there is an allergic component to this, I will try a cephalosporin since he has already been on avelox and zmax

## 2010-11-10 NOTE — Assessment & Plan Note (Signed)
His BP is well controlled, will monitor his renal function and lytes today

## 2010-11-10 NOTE — Patient Instructions (Signed)
Bronchitis Bronchitis is the body's way of reacting to injury and/or infection (inflammation) of the bronchi. Bronchi are the air tubes that extend from the windpipe into the lungs. If the inflammation becomes severe, it may cause shortness of breath.  CAUSES Inflammation may be caused by:  A virus.   Germs (bacteria).   Dust.   Allergens.   Pollutants and many other irritants.  The cells lining the bronchial tree are covered with tiny hairs (cilia). These constantly beat upward, away from the lungs, toward the mouth. This keeps the lungs free of pollutants. When these cells become too irritated and are unable to do their job, mucus begins to develop. This causes the characteristic cough of bronchitis. The cough clears the lungs when the cilia are unable to do their job. Without either of these protective mechanisms, the mucus would settle in the lungs. Then you would develop pneumonia. Smoking is a common cause of bronchitis and can contribute to pneumonia. Stopping this habit is the single most important thing you can do to help yourself. TREATMENT  Your caregiver may prescribe an antibiotic if the cough is caused by bacteria. Also, medicines that open up your airways make it easier to breathe. Your caregiver may also recommend or prescribe an expectorant. It will loosen the mucus to be coughed up. Only take over-the-counter or prescription medicines for pain, discomfort, or fever as directed by your caregiver.   Removing whatever causes the problem (smoking, for example) is critical to preventing the problem from getting worse.   Cough suppressants may be prescribed for relief of cough symptoms.   Inhaled medicines may be prescribed to help with symptoms now and to help prevent problems from returning.   For those with recurrent (chronic) bronchitis, there may be a need for steroid medicines.  SEEK IMMEDIATE MEDICAL CARE IF:  During treatment, you develop more pus-like mucus  (purulent sputum).   You or your child has an oral temperature above 100.5, not controlled by medicine.   Your baby is older than 3 months with a rectal temperature of 102 F (38.9 C) or higher.   Your baby is 3 months old or younger with a rectal temperature of 100.4 F (38 C) or higher.   You become progressively more ill.   You have increased difficulty breathing, wheezing, or shortness of breath.  It is necessary to seek immediate medical care if you are elderly or sick from any other disease. MAKE SURE YOU:  Understand these instructions.   Will watch your condition.   Will get help right away if you are not doing well or get worse.  Document Released: 07/24/2005 Document Re-Released: 10/18/2009 Exit[employer] Patient Information 2011 [employer]. 

## 2010-11-10 NOTE — Progress Notes (Signed)
Subjective:     Adrian [name] is a 70 y.o. male who presents for evaluation of symptoms of a URI. Symptoms include bilateral ear pressure/pain, congestion, nasal congestion, no  fever, productive cough with  yellow colored sputum, purulent nasal discharge and sinus pressure. Onset of symptoms was 1 month ago, and has been unchanged since that time. Treatment to date: antibiotics, cough suppressants and decongestants.  The following portions of the patient's history were reviewed and updated as appropriate: allergies, current medications, past family history, past medical history, past social history, past surgical history and problem list.  Review of Systems Constitutional: positive for fatigue, negative for anorexia, chills, fevers, malaise, night sweats, sweats and weight loss Ears, nose, mouth, throat, and face: positive for nasal congestion and sore throat, negative for ear drainage, earaches, epistaxis, facial trauma, hearing loss, hoarseness, snoring, sore mouth, tinnitus and voice change Respiratory: positive for cough, negative for asthma, chronic bronchitis, dyspnea on exertion, emphysema, hemoptysis, pleurisy/chest pain, pneumonia, sputum, stridor and wheezing   Objective:    BP 98/52  Pulse 50  Temp(Src) 97.5 F (36.4 C) (Oral)  Ht 5\' 2"  (1.575 m)  Wt 155 lb (70.308 kg)  BMI 28.35 kg/m2  SpO2 98% General appearance: alert, cooperative, appears stated age and no distress Head: Normocephalic, without obvious abnormality, atraumatic Ears: normal TM's and external ear canals both ears Nose: Nares normal. Septum midline. Mucosa normal. No drainage or sinus tenderness. Throat: lips, mucosa, and tongue normal; teeth and gums normal Neck: no adenopathy, no carotid bruit, no JVD, supple, symmetrical, trachea midline and thyroid not enlarged, symmetric, no tenderness/mass/nodules Lungs: clear to auscultation bilaterally Chest wall: no tenderness Heart: regular rate and rhythm, S1, S2  normal, no murmur, click, rub or gallop Abdomen: soft, non-tender; bowel sounds normal; no masses,  no organomegaly Extremities: extremities normal, atraumatic, no cyanosis or edema Skin: Skin color, texture, turgor normal. No rashes or lesions Lymph nodes: Cervical, supraclavicular, and axillary nodes normal.  Lab Results  Component Value Date   WBC 5.1 05/18/2010   HGB 15.5 05/18/2010   HCT 44.5 05/18/2010   PLT 199.0 05/18/2010   CHOL 192 08/25/2008   TRIG 184* 08/25/2008   HDL 40.2 08/25/2008   ALT 26 05/18/2010   AST 19 05/18/2010   NA 141 05/18/2010   K 4.1 05/18/2010   CL 106 05/18/2010   CREATININE 0.7 05/18/2010   BUN 18 05/18/2010   CO2 31 05/18/2010   TSH 2.98 05/18/2010   PSA 1.68 05/18/2010   HGBA1C 6.6* 05/18/2010   MICROALBUR 0.9 09/14/2009    Assessment:    bronchitis probably bacterial, maybe allergic/inflammatory  Plan:    Discussed diagnosis and treatment of URI. Suggested symptomatic OTC remedies. Nasal saline spray for congestion. Ceftin per orders. Follow up as needed. Call in 3 days if symptoms aren't resolving. Follow up in 2 weeks or as needed. Follow up with PCP in 2 weeks or as needed.

## 2010-12-06 ENCOUNTER — Ambulatory Visit (INDEPENDENT_AMBULATORY_CARE_PROVIDER_SITE_OTHER): Payer: Medicare Other | Admitting: Internal Medicine

## 2010-12-07 ENCOUNTER — Ambulatory Visit (INDEPENDENT_AMBULATORY_CARE_PROVIDER_SITE_OTHER): Payer: Medicare Other | Admitting: Internal Medicine

## 2010-12-07 ENCOUNTER — Encounter: Payer: Self-pay | Admitting: Internal Medicine

## 2010-12-07 VITALS — BP 110/64 | HR 60 | Temp 97.0°F | Resp 16 | Wt 152.0 lb

## 2010-12-07 DIAGNOSIS — R05 Cough: Secondary | ICD-10-CM

## 2010-12-07 DIAGNOSIS — E119 Type 2 diabetes mellitus without complications: Secondary | ICD-10-CM

## 2010-12-07 DIAGNOSIS — I10 Essential (primary) hypertension: Secondary | ICD-10-CM

## 2010-12-07 DIAGNOSIS — E7800 Pure hypercholesterolemia, unspecified: Secondary | ICD-10-CM

## 2010-12-07 DIAGNOSIS — E781 Pure hyperglyceridemia: Secondary | ICD-10-CM | POA: Insufficient documentation

## 2010-12-07 HISTORY — DX: Pure hyperglyceridemia: E78.1

## 2010-12-07 MED ORDER — OMEGA-3-ACID ETHYL ESTERS 1 G PO CAPS: 2.0000 g | ORAL_CAPSULE | Freq: Two times a day (BID) | ORAL | Status: DC

## 2010-12-07 MED ORDER — SAXAGLIPTIN-METFORMIN ER 5-1000 MG PO TB24: 1.0000 | ORAL_TABLET | Freq: Every day | ORAL | Status: DC

## 2010-12-07 NOTE — Patient Instructions (Signed)
Diabetes, Type 2 Diabetes is a lasting (chronic) disease. In type 2 diabetes, the pancreas does not make enough insulin (a hormone), and the body does not respond normally to the insulin that is made. This type of diabetes was also previously called adult onset diabetes. About 90% of all those who have diabetes have type 2. It usually occurs after the age of 40 but can occur at any age. CAUSES Unlike type 1 diabetes, which happens because insulin is no longer being made, type 2 diabetes happens because the body is making less insulin and has trouble using the insulin properly. SYMPTOMS  Drinking more than usual.   Urinating more than usual.   Blurred vision.   Dry, itchy skin.   Frequent infection like yeast infections in women.   More tired than usual (fatigue).  TREATMENT  Healthy eating.   Exercise.   Medication, if needed.   Monitoring blood glucose (sugar).   Seeing your caregiver regularly.  HOME CARE INSTRUCTIONS  Check your blood glucose (sugar) at least once daily. More frequent monitoring may be necessary, depending on your medications and on how well your diabetes is controlled. Your caregiver will advise you.   Take your medicine as directed by your caregiver.   Do not smoke.   Make wise food choices. Ask your caregiver for information. Weight loss can improve your diabetes.   Learn about low blood glucose (hypoglycemia) and how to treat it.   Get your eyes checked regularly.   Have a yearly physical exam. Have your blood pressure checked. Get your blood and urine tested.   Wear a pendant or bracelet saying that you have diabetes.   Check your feet every night for sores. Let your caregiver know if you have sores that are not healing.  SEEK MEDICAL CARE IF:  You are having problems keeping your blood glucose at target range.   You feel you might be having problems with your medicines.   You have symptoms of an illness that is not improving after 24  hours.   You have a sore or wound that is not healing.   You notice a change in vision or a new problem with your vision.   You develop a fever of more than 100.5.  Document Released: 07/24/2005 Document Re-Released: 08/15/2009 [employer] Patient Information 2011 [employer]. 

## 2010-12-07 NOTE — Assessment & Plan Note (Signed)
Start Kombiglyze ER for diabetes

## 2010-12-07 NOTE — Assessment & Plan Note (Signed)
Start lovaza 

## 2010-12-07 NOTE — Assessment & Plan Note (Signed)
His BP is well controlled 

## 2010-12-07 NOTE — Progress Notes (Signed)
Subjective:    Patient ID: Adrian [name], male    DOB: [birth year], 70 y.o.   MRN: [id number]  Diabetes He presents for his initial diabetic visit. He has type 2 diabetes mellitus. No MedicAlert identification noted. His disease course has been stable. There are no hypoglycemic associated symptoms. Pertinent negatives for hypoglycemia include no confusion, dizziness, headaches, nervousness/anxiousness, pallor, seizures, speech difficulty or tremors. Associated symptoms include polydipsia, polyphagia and polyuria. Pertinent negatives for diabetes include no blurred vision, no chest pain, no fatigue, no foot paresthesias, no foot ulcerations, no visual change, no weakness and no weight loss. There are no hypoglycemic complications. Symptoms are worsening. There are no diabetic complications. When asked about current treatments, none were reported. His weight is increasing steadily. He is following a high fat/cholesterol diet. When asked about meal planning, he reported none. He has not had a previous visit with a dietician. He participates in exercise intermittently. There is no change in his home blood glucose trend. An ACE inhibitor/angiotensin II receptor blocker is being taken. He does not see a podiatrist.Eye exam is current.  Hyperlipidemia This is a new problem. This is a new diagnosis. The problem is uncontrolled. Recent lipid tests were reviewed and are high. Exacerbating diseases include diabetes and obesity. Factors aggravating his hyperlipidemia include fatty foods. Pertinent negatives include no chest pain, focal sensory loss, focal weakness, leg pain, myalgias or shortness of breath. Current antihyperlipidemic treatment includes statins. The current treatment provides moderate improvement of lipids. Compliance problems include adherence to exercise and adherence to diet.       Review of Systems  Constitutional: Negative for fever, chills, weight loss, diaphoresis, activity change, appetite  change, fatigue and unexpected weight change.  HENT: Negative for facial swelling, neck pain and neck stiffness.   Eyes: Negative for blurred vision.  Respiratory: Negative for apnea, cough, choking, chest tightness, shortness of breath, wheezing and stridor.   Cardiovascular: Negative for chest pain, palpitations and leg swelling.  Gastrointestinal: Negative for nausea, vomiting, abdominal pain, diarrhea, constipation and abdominal distention.  Genitourinary: Positive for polyuria. Negative for dysuria, urgency, hematuria, decreased urine volume, difficulty urinating and penile pain.  Musculoskeletal: Negative for myalgias, back pain, joint swelling, arthralgias and gait problem.  Skin: Negative for color change, pallor and rash.  Neurological: Negative for dizziness, tremors, focal weakness, seizures, syncope, facial asymmetry, speech difficulty, weakness, light-headedness, numbness and headaches.  Hematological: Positive for polydipsia and polyphagia. Negative for adenopathy. Does not bruise/bleed easily.  Psychiatric/Behavioral: Negative for behavioral problems, confusion, dysphoric mood and agitation. The patient is not nervous/anxious.        Objective:   Physical Exam  [vitalsreviewed. Constitutional: He appears well-developed and well-nourished. No distress.  HENT:  Head: Normocephalic and atraumatic.  Right Ear: External ear normal.  Left Ear: External ear normal.  Nose: Nose normal.  Mouth/Throat: Oropharynx is clear and moist. No oropharyngeal exudate.  Eyes: Conjunctivae and EOM are normal. Pupils are equal, round, and reactive to light. Right eye exhibits no discharge. Left eye exhibits no discharge. No scleral icterus.  Neck: Normal range of motion. Neck supple. No JVD present. No tracheal deviation present. No thyromegaly present.  Pulmonary/Chest: No stridor.  Lymphadenopathy:    He has no cervical adenopathy.  Skin: He is not diaphoretic.        Lab Results    Component Value Date   WBC 6.8 11/10/2010   HGB 14.3 11/10/2010   HCT 41.1 11/10/2010   PLT 227.0 11/10/2010   CHOL 189 11/10/2010  TRIG 240.0* 11/10/2010   HDL 40.50 11/10/2010   LDLDIRECT 116.6 11/10/2010   ALT 41 11/10/2010   AST 16 11/10/2010   NA 141 11/10/2010   K 3.8 11/10/2010   CL 102 11/10/2010   CREATININE 0.9 11/10/2010   BUN 26* 11/10/2010   CO2 32 11/10/2010   TSH 2.23 11/10/2010   PSA 1.68 05/18/2010   HGBA1C 8.1* 11/10/2010   MICROALBUR 0.9 09/14/2009    Assessment & Plan:

## 2010-12-14 ENCOUNTER — Telehealth: Payer: Self-pay | Admitting: *Deleted

## 2010-12-14 NOTE — Telephone Encounter (Signed)
Male called for pt - BP today 95/49, she wants to know if this is "bad". Spoke w/ wife - He feels "fine" denies dizziness, sob, pain, or lightheadedness. I advised she check on reg basis and continue to monitor pt - if symptoms ER or OV. She agreed.

## 2010-12-23 NOTE — Op Note (Signed)
[facility]  Patient:    Adrian [name], Adrian [name] Visit Number: [id number] MRN: [id number]          Service Type: SUR Location: [id number] Attending Physician:  Marlowe Kays Page Dictated by:   Illene Labrador. Aplington, M.D. Proc. Date: 12/27/01 Admit Date:  12/27/2001 Discharge Date: 12/28/2001                             Operative Report  PREOPERATIVE DIAGNOSES:  1. Degenerative arthritis, AC joint.  2. 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  POSTOPERATIVE DIAGNOSES:  1. Degenerative arthritis, AC joint.  2. Chronic impingement syndrome with rotator cuff tendinopathy right shoulder.  OPERATION/PROCEDURE:  1. Right shoulder arthroscopy with arthroscopic subacromial decompression.  2. Open resection distal clavicle right shoulder.  SURGEON:  Illene Labrador. Aplington, M.D.  ASSISTANT:  Clarene Reamer, PA-C  ANESTHESIA:  General  FINDINGS:  He has had an impingement problem on the left shoulder with good surgical correction with the arthroscopic SAD.  On the right shoulder he had pains at times at the [facility] joint with degenerative changes on x-ray and on the MRI and tendonopathy of the rotator cuff without a tear.  Glenohumeral joint was felt to be unremarkable.  DESCRIPTION OF PROCEDURE:  Satisfactory general anesthesia, beach chair position on the Schlein frame.  Right shoulder girdle was prepped with DuraPrep and draped in a sterile field and anatomy of the shoulder was marked out and posterior soft spot, lateral portal, subacromial joint, and tentative incision for the distal clavicle.  Resection were infiltrated with 1/2% Marcaine with adrenalin.  On posterior soft spot, and with a little bit of difficulty, I was able to place a blunt trocar into the glenohumeral joint. Visualization was suboptimal due to some bleeding.  The joint, in general seemed to be very inflamed.  The labrum was intact.  Biceps tendon was  intact but inflamed.  Posterior humeral head had some areas of erosion which I felt were probably due to my difficulty getting into the joint.  Also, in keeping with the normal MRI in the glenohumeral joint.  I then redirected the scope into the subacromial area and through a lateral portal inserted this blunt trocar followed by 4.2 cannula.  He had a very thick, exuberant, subdeltoid bursa which I resected and then introduced the Arthrocare vaporizer and began removing soft tissue on the medial surface of the acromion and around the anterior border including the coracoacromial ligament.  Then I introduced a 4-0 oval bur through the lateral portal and began burring down the undersurface of the anterior acromion, then went back and forth between the bur, the Arthrocare vaporizer, and the 4.2 shaver until I felt that I had a thorough decompression performed as evidenced with the arm to the side and also abducted with documentary pictures taken.  I then evacuated all fluid from this joint and made incision over the distal clavicle and exposed the lateral portion on the [facility] joint.  I measured back roughly 1.5 cm and made a mark on the clavicle with cutting cautery.  I then undermined carefully the distal clavicle, placed a baby Homans there and used a microsaw to amputate the distal clavicle.  Small spicules remaining in the apparent clavicle removed with rongeur and the raw bone was then covered with bone wax.  I then irrigated the wound well with sterile saline, packed the resected area with Gelfoam.  The fascia and  muscle over the remaining clavicle, and the resected area were closed with interrupted #1 Vicryl, subcutaneous tissue with 2-0 Vicryl with Steri-Strips on the skin and 3-0 Nylon in the portals. Adaptic dry sterile dressing, and large shoulder immobilizer applied. Tolerated the procedure well and taken to recovery room in satisfactory condition with no known  complications. Dictated by:   Illene Labrador. Aplington, M.D. Attending Physician:  Joaquin Courts DD:  12/27/01 TD:  12/31/01 Job: [id number]

## 2010-12-29 ENCOUNTER — Encounter: Payer: Medicare Other | Attending: Internal Medicine | Admitting: Dietician

## 2010-12-29 ENCOUNTER — Other Ambulatory Visit: Payer: Self-pay | Admitting: *Deleted

## 2010-12-29 DIAGNOSIS — E119 Type 2 diabetes mellitus without complications: Secondary | ICD-10-CM | POA: Insufficient documentation

## 2010-12-29 DIAGNOSIS — Z713 Dietary counseling and surveillance: Secondary | ICD-10-CM | POA: Insufficient documentation

## 2010-12-29 MED ORDER — PRAVASTATIN SODIUM 80 MG PO TABS: 80.0000 mg | ORAL_TABLET | Freq: Every day | ORAL | Status: DC

## 2011-02-13 ENCOUNTER — Other Ambulatory Visit: Payer: Self-pay | Admitting: Internal Medicine

## 2011-03-01 ENCOUNTER — Ambulatory Visit: Payer: Medicare Other | Admitting: Dietician

## 2011-03-15 ENCOUNTER — Ambulatory Visit: Payer: Medicare Other | Admitting: Internal Medicine

## 2011-03-15 ENCOUNTER — Encounter: Payer: Medicare Other | Attending: Internal Medicine | Admitting: Dietician

## 2011-03-15 DIAGNOSIS — Z713 Dietary counseling and surveillance: Secondary | ICD-10-CM | POA: Insufficient documentation

## 2011-03-15 DIAGNOSIS — E119 Type 2 diabetes mellitus without complications: Secondary | ICD-10-CM | POA: Insufficient documentation

## 2011-03-15 NOTE — Progress Notes (Signed)
  Medical Nutrition Therapy:  Appt start time: 1500 end time: 1400.  Assessment:  Primary concerns today: Diabetes follow-up.  Has had cataract surgery to Rt.eye on January 31, 2011, Will have Lf eye done on August 14 th.  Has not been exercising due to the surgery.  Has been trying to follow the diet.  Taking his glucose medications along with his other medications.  Monitoring BG daily at the fasting time.  Results range from 98, 102, 103, 120, 1322 does not have his glucose log book with him.  Denies any issues with his feet, any signs and symptoms of hypoglycemia. Concerned that it is difficult for him to obtain a good size drop of blood for testing.  I obtained a Free Style BGM Kit and we tried lancing and worked on getting a larger drop of blood.  Meter Lot #: [id number] Expiration date: 12/2011 was provided.  Finger stick HgA1C reveals a decrease to 5.3% from 8.1% since 12/07/2010.  MEDICATIONS: Review of medications reveals he is taking all of his currently prescribed medications.  DIETARY INTAKE: 24-hr recall:  B (Morning): Cereal (Cherrioes, strawberries and blue berries, no milk) or (2) 5 inch pancakes with blueberries and black coffee.  Snk (No  AM snack as a rule)   L (Mid-day  PM): Protein (beef, chicken, fish) with a non-starchy vegetable such as green beans, a starchy vegetable such as corn or beans, and unsweetened tea.  Rare afternoon snack  D (Early Evening, lighter meal than lunch.  Will have a PB sandwich and apple. Snk (Bed time snack is not consistent.  Will have an apple with peanut butter some nights.  Recent physical activity: Decreased activity due to cataract surgery.  Progress Towards Goal(s):  In progress.   Nutritional Diagnosis:  [place]-2.1 Inpaired nutrition utilization As related to glucose.  As evidenced by a diagnosis of DM 2.    Intervention:  Nutrition: he is to continue to limit candy and sweets and to keep-up the increased intake of non-starchy vegetables,  fish, chicken, and lean beef.  Monitoring/Evaluation:  Dietary intake, exercise, blood glucose monitoring, and body weight in 3 months, sometime Nov. Or Dec.

## 2011-03-15 NOTE — Patient Instructions (Addendum)
   When the Eye surgeon gives permission to be more active, get back to walking on a daily basis.  Your doctor needs to write a prescription for you to monitor 2 times per day.  It is good to have the opportunity to monitor at other times of the day.   Continue to eat the small portions at meals and snacks.  Continue the things you are doing because they have helped you have a HgA1C of 5.3% today.  If you decide that you want a sweet, have a small serving with the meal.  Only 1 small candy occasionally    Follow-up in late  fall (Nov. Or Dec).  Call the [phone] to set-up appointment.

## 2011-03-23 ENCOUNTER — Ambulatory Visit: Payer: Medicare Other | Admitting: Internal Medicine

## 2011-04-05 ENCOUNTER — Other Ambulatory Visit (INDEPENDENT_AMBULATORY_CARE_PROVIDER_SITE_OTHER): Payer: Medicare Other

## 2011-04-05 ENCOUNTER — Encounter: Payer: Self-pay | Admitting: Internal Medicine

## 2011-04-05 ENCOUNTER — Ambulatory Visit (INDEPENDENT_AMBULATORY_CARE_PROVIDER_SITE_OTHER): Payer: Medicare Other | Admitting: Internal Medicine

## 2011-04-05 DIAGNOSIS — E7800 Pure hypercholesterolemia, unspecified: Secondary | ICD-10-CM

## 2011-04-05 DIAGNOSIS — E119 Type 2 diabetes mellitus without complications: Secondary | ICD-10-CM

## 2011-04-05 DIAGNOSIS — I10 Essential (primary) hypertension: Secondary | ICD-10-CM

## 2011-04-05 DIAGNOSIS — E781 Pure hyperglyceridemia: Secondary | ICD-10-CM

## 2011-04-05 LAB — CBC WITH DIFFERENTIAL/PLATELET
Basophils Absolute: 0 10*3/uL (ref 0.0–0.1)
HCT: 43 % (ref 39.0–52.0)
Lymphs Abs: 2 10*3/uL (ref 0.7–4.0)
Monocytes Absolute: 0.8 10*3/uL (ref 0.1–1.0)
Monocytes Relative: 9.7 % (ref 3.0–12.0)
Neutrophils Relative %: 62.8 % (ref 43.0–77.0)
Platelets: 180 10*3/uL (ref 150.0–400.0)
RDW: 12.8 % (ref 11.5–14.6)

## 2011-04-05 LAB — LIPID PANEL
HDL: 58 mg/dL (ref >39.00)
LDL Cholesterol: 69 mg/dL (ref 0–99)
Total CHOL/HDL Ratio: 2
Triglycerides: 59 mg/dL (ref 0.0–149.0)
VLDL: 11.8 mg/dL (ref 0.0–40.0)

## 2011-04-05 LAB — COMPREHENSIVE METABOLIC PANEL
ALT: 25 U/L (ref 0–53)
AST: 21 U/L (ref 0–37)
Chloride: 102 mEq/L (ref 96–112)
Creatinine, Ser: 0.7 mg/dL (ref 0.4–1.5)
Total Bilirubin: 1.4 mg/dL — ABNORMAL HIGH (ref 0.3–1.2)

## 2011-04-05 LAB — HEMOGLOBIN A1C: Hgb A1c MFr Bld: 5.9 % (ref 4.6–6.5)

## 2011-04-05 MED ORDER — COLESEVELAM HCL 3.75 G PO PACK: 1.0000 | PACK | Freq: Every day | ORAL | Status: DC

## 2011-04-05 NOTE — Assessment & Plan Note (Signed)
I will recheck his FLP today 

## 2011-04-05 NOTE — Assessment & Plan Note (Signed)
FLP and TSH and CMP today

## 2011-04-05 NOTE — Progress Notes (Signed)
Subjective:    Patient ID: Adrian [name], male    DOB: [birth year], 70 y.o.   MRN: [id number]  Diabetes He presents for his follow-up diabetic visit. He has type 2 diabetes mellitus. His disease course has been stable. There are no hypoglycemic associated symptoms. Pertinent negatives for hypoglycemia include no dizziness, headaches, pallor, seizures, speech difficulty, sweats or tremors. There are no diabetic associated symptoms. Pertinent negatives for diabetes include no blurred vision, no chest pain, no fatigue and no weakness. There are no hypoglycemic complications. Symptoms are stable. There are no diabetic complications. Current diabetic treatment includes oral agent (dual therapy). He is compliant with treatment all of the time. His weight is stable. He is following a generally healthy diet. Meal planning includes avoidance of concentrated sweets. He has not had a previous visit with a dietician. He participates in exercise intermittently. There is no change in his home blood glucose trend. His breakfast blood glucose range is generally 90-110 mg/dl. His lunch blood glucose range is generally 90-110 mg/dl. His dinner blood glucose range is generally 90-110 mg/dl. His highest blood glucose is 90-110 mg/dl. His overall blood glucose range is 90-110 mg/dl. An ACE inhibitor/angiotensin II receptor blocker is being taken. He does not see a podiatrist.Eye exam is current.  Hypertension This is a chronic problem. The current episode started more than 1 year ago. The problem has been gradually improving since onset. The problem is controlled. Pertinent negatives include no anxiety, blurred vision, chest pain, headaches, malaise/fatigue, neck pain, orthopnea, palpitations, peripheral edema, PND, shortness of breath or sweats. Past treatments include angiotensin blockers and diuretics. The current treatment provides significant improvement. There are no compliance problems.       Review of Systems    Constitutional: Negative for fever, chills, malaise/fatigue, diaphoresis, activity change, appetite change, fatigue and unexpected weight change.  HENT: Negative for sore throat, facial swelling, trouble swallowing, neck pain and voice change.   Eyes: Negative.  Negative for blurred vision.  Respiratory: Negative for cough, choking, chest tightness, shortness of breath, wheezing and stridor.   Cardiovascular: Negative for chest pain, palpitations, orthopnea, leg swelling and PND.  Gastrointestinal: Negative for nausea, vomiting, abdominal pain, diarrhea, constipation, blood in stool, abdominal distention and anal bleeding.  Genitourinary: Negative for dysuria, urgency, frequency, hematuria, flank pain, decreased urine volume, enuresis and difficulty urinating.  Musculoskeletal: Negative for myalgias, back pain, joint swelling, arthralgias and gait problem.  Skin: Negative for color change, pallor, rash and wound.  Neurological: Negative for dizziness, tremors, seizures, syncope, facial asymmetry, speech difficulty, weakness, light-headedness, numbness and headaches.  Hematological: Negative for adenopathy. Does not bruise/bleed easily.  Psychiatric/Behavioral: Negative.        Objective:   Physical Exam  Vitals reviewed. Constitutional: He is oriented to person, place, and time. He appears well-developed and well-nourished. No distress.  HENT:  Head: Normocephalic.  Mouth/Throat: Oropharynx is clear and moist. No oropharyngeal exudate.  Eyes: Conjunctivae are normal. Right eye exhibits no discharge. Left eye exhibits no discharge. No scleral icterus.  Neck: Normal range of motion. Neck supple. No JVD present. No tracheal deviation present. No thyromegaly present.  Cardiovascular: Normal rate, regular rhythm, normal heart sounds and intact distal pulses.  Exam reveals no gallop and no friction rub.   No murmur heard. Pulmonary/Chest: Effort normal and breath sounds normal. No stridor. No  respiratory distress. He has no wheezes. He exhibits no tenderness.  Abdominal: Soft. Bowel sounds are normal. He exhibits no distension and no mass. There is no tenderness.  There is no rebound and no guarding.  Musculoskeletal: Normal range of motion. He exhibits no edema and no tenderness.  Lymphadenopathy:    He has no cervical adenopathy.  Neurological: He is alert and oriented to person, place, and time. He has normal reflexes. He displays normal reflexes. No cranial nerve deficit. He exhibits normal muscle tone. Coordination normal.  Skin: Skin is warm and dry. No rash noted. He is not diaphoretic. No erythema. No pallor.  Psychiatric: He has a normal mood and affect. His behavior is normal. Judgment and thought content normal.     Lab Results  Component Value Date   WBC 6.8 11/10/2010   HGB 14.3 11/10/2010   HCT 41.1 11/10/2010   PLT 227.0 11/10/2010   CHOL 189 11/10/2010   TRIG 240.0* 11/10/2010   HDL 40.50 11/10/2010   LDLDIRECT 116.6 11/10/2010   ALT 41 11/10/2010   AST 16 11/10/2010   NA 141 11/10/2010   K 3.8 11/10/2010   CL 102 11/10/2010   CREATININE 0.9 11/10/2010   BUN 26* 11/10/2010   CO2 32 11/10/2010   TSH 2.23 11/10/2010   PSA 1.68 05/18/2010   HGBA1C 8.1* 11/10/2010   MICROALBUR 0.9 09/14/2009      Assessment & Plan:

## 2011-04-05 NOTE — Assessment & Plan Note (Signed)
His BP is well controlled I will check his lytes and renal function 

## 2011-04-05 NOTE — Patient Instructions (Signed)
Diabetes, Type 2 Diabetes is a lasting (chronic) disease. In type 2 diabetes, the pancreas does not make enough insulin (a hormone), and the body does not respond normally to the insulin that is made. This type of diabetes was also previously called adult onset diabetes. About 90% of all those who have diabetes have type 2. It usually occurs after the age of 40 but can occur at any age. CAUSES Unlike type 1 diabetes, which happens because insulin is no longer being made, type 2 diabetes happens because the body is making less insulin and has trouble using the insulin properly. SYMPTOMS  Drinking more than usual.   Urinating more than usual.   Blurred vision.   Dry, itchy skin.   Frequent infection like yeast infections in women.   More tired than usual (fatigue).  TREATMENT  Healthy eating.   Exercise.   Medication, if needed.   Monitoring blood glucose (sugar).   Seeing your caregiver regularly.  HOME CARE INSTRUCTIONS  Check your blood glucose (sugar) at least once daily. More frequent monitoring may be necessary, depending on your medications and on how well your diabetes is controlled. Your caregiver will advise you.   Take your medicine as directed by your caregiver.   Do not smoke.   Make wise food choices. Ask your caregiver for information. Weight loss can improve your diabetes.   Learn about low blood glucose (hypoglycemia) and how to treat it.   Get your eyes checked regularly.   Have a yearly physical exam. Have your blood pressure checked. Get your blood and urine tested.   Wear a pendant or bracelet saying that you have diabetes.   Check your feet every night for sores. Let your caregiver know if you have sores that are not healing.  SEEK MEDICAL CARE IF:  You are having problems keeping your blood glucose at target range.   You feel you might be having problems with your medicines.   You have symptoms of an illness that is not improving after 24  hours.   You have a sore or wound that is not healing.   You notice a change in vision or a new problem with your vision.   You develop a fever of more than 100.5.  Document Released: 07/24/2005 Document Re-Released: 08/15/2009 [employer] Patient Information 2011 [employer]. 

## 2011-04-05 NOTE — Assessment & Plan Note (Signed)
I will check his A1C and will monitor his renal function 

## 2011-04-14 ENCOUNTER — Telehealth: Payer: Self-pay

## 2011-04-14 ENCOUNTER — Ambulatory Visit (INDEPENDENT_AMBULATORY_CARE_PROVIDER_SITE_OTHER): Payer: Medicare Other | Admitting: Internal Medicine

## 2011-04-14 ENCOUNTER — Encounter: Payer: Self-pay | Admitting: Internal Medicine

## 2011-04-14 DIAGNOSIS — E119 Type 2 diabetes mellitus without complications: Secondary | ICD-10-CM

## 2011-04-14 DIAGNOSIS — R197 Diarrhea, unspecified: Secondary | ICD-10-CM

## 2011-04-14 DIAGNOSIS — H1030 Unspecified acute conjunctivitis, unspecified eye: Secondary | ICD-10-CM | POA: Insufficient documentation

## 2011-04-14 DIAGNOSIS — I10 Essential (primary) hypertension: Secondary | ICD-10-CM

## 2011-04-14 HISTORY — DX: Diarrhea, unspecified: R19.7

## 2011-04-14 HISTORY — DX: Unspecified acute conjunctivitis, unspecified eye: H10.30

## 2011-04-14 MED ORDER — TOBRAMYCIN 0.3 % OP SOLN: 1.0000 [drp] | Freq: Four times a day (QID) | OPHTHALMIC | Status: AC

## 2011-04-14 NOTE — Patient Instructions (Signed)
Take all new medications as prescribed (total 10 days use of the antibiotic drops) Please hold on taking the welchol for 1 wk, then re-start If the diarrhea re-occurs, please stop the welchol completely Continue all other medications as before

## 2011-04-14 NOTE — Assessment & Plan Note (Signed)
Mild to mod, for antibx course,  to f/u any worsening symptoms or concerns 

## 2011-04-14 NOTE — Assessment & Plan Note (Signed)
?   Incidental viral vs anxiety related vs welchol, now improved;  Would be unusual for the welchol, ok to stay off med for now and re-try in 1 wk  - to d/c welchol if the diarrhea recurs

## 2011-04-14 NOTE — Telephone Encounter (Signed)
Patient wife called c/o diarrhea/ heart burn. Returned call back to pt/LMOVM to call and schedule appt with other MD.

## 2011-04-15 ENCOUNTER — Encounter: Payer: Self-pay | Admitting: Internal Medicine

## 2011-04-15 NOTE — Progress Notes (Signed)
Subjective:    Patient ID: Adrian [name], male    DOB: 03/26/1941, 70 y.o.   MRN: [id number]  HPI  Here with transient recent GI illness with crampy abd pains (but not sure if actually worse acutely on chronic) , loose watery stools/mult per day without blood, and nausea with perceived increased reflux, without vomiting, wt loss, back pain, not sure about fever but no chills;  All for 3 days after starting welchol, seemed better late yesterday and today after stopped the welchol,  But also with onset 2-3 days eye redness with AM matting, no itching or vision change or sinus/ear symptoms.    Pt denies chest pain, increased sob or doe, wheezing, orthopnea, PND, increased LE swelling, palpitations, dizziness or syncope.  Pt denies new neurological symptoms such as new headache, or facial or extremity weakness or numbness   Pt denies polydipsia, polyuria, and Denies worsening depressive symptoms, suicidal ideation, or panic Past Medical History  Diagnosis Date  . DIABETES MELLITUS 11/11/2008  . HYPERCHOLESTEROLEMIA 05/01/2008  . ERECTILE DYSFUNCTION 05/18/2010  . 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 ESSENTIAL HYPERTENSION 05/01/2008  . GASTRITIS 01/05/2010  . RENAL CYST 09/07/2008  . Helicobacter pylori (H. pylori)   . Hx of colonic polyps    Past Surgical History  Procedure Date  . Hernia repair   . Left rotator cuff repair   . Right wrist surgery   . Eye surgery     (R) glaucoma (to relieve pressure)    reports that he has quit smoking. He does not have any smokeless tobacco history on file. He reports that he does not drink alcohol or use illicit drugs. family history includes Diabetes in his brother and mother and Hypertension in his other. Allergies  Allergen Reactions  . Lisinopril     REACTION: cough   Current Outpatient Prescriptions on File Prior to Visit  Medication Sig Dispense Refill  . Colesevelam HCl [facility]) 3.75 G PACK Take 1 each by mouth daily.  90 each  3  .  dorzolamide-timolol (COSOPT) 22.3-6.8 MG/ML ophthalmic solution Place 1 drop into the right eye daily.        . FreeStyle Unistick II Lancets MISC by Does not apply route daily.        Marland Kitchen glucose blood (FREESTYLE LITE) test strip 1 each by Other route daily. Use as instructed       . losartan-hydrochlorothiazide (HYZAAR) 100-12.5 MG per tablet TAKE 1 TABLET BY MOUTH EVERY DAY  30 tablet  6  . omega-3 acid ethyl esters (LOVAZA) 1 G capsule Take 2 g by mouth 2 (two) times daily.        . pravastatin (PRAVACHOL) 80 MG tablet Take 1 tablet (80 mg total) by mouth daily.  30 tablet  5  . pseudoephedrine-guaifenesin (TUSSIN PE) 30-100 MG/5ML SYRP Take 5 mLs by mouth 4 (four) times daily as needed.        . Saxagliptin-Metformin (KOMBIGLYZE XR) 12-998 MG TB24 Take 1 tablet by mouth daily.  140 tablet  0  . traMADol (ULTRAM) 50 MG tablet Take 50 mg by mouth every 6 (six) hours as needed.        . tadalafil (CIALIS) 20 MG tablet Take 20 mg by mouth daily as needed.         Review of Systems Review of Systems  Constitutional: Negative for diaphoresis and unexpected weight change.  HENT: Negative for drooling and tinnitus.   Eyes: Negative for photophobia and visual disturbance.  Respiratory: Negative  for choking and stridor.   Gastrointestinal: Negative for vomiting and blood in stool.  Genitourinary: Negative for hematuria and decreased urine volume.     Objective:   Physical Exam BP 124/68  Pulse 63  Temp(Src) 98.1 F (36.7 C) (Oral)  Ht 5\' 2"  (1.575 m)  Wt 150 lb 2 oz (68.096 kg)  BMI 27.46 kg/m2  SpO2 97% Physical Exam  VS noted Constitutional: Pt appears well-developed and well-nourished.  HENT: Head: Normocephalic.  Right Ear: External ear normal.  Left Ear: External ear normal.  Bilat tm's mild erythema.  Sinus nontender.  Pharynx mild erythema Eyes: Conjunctivae and EOM are normal. Pupils are equal, round, and reactive to light. bilat conjunctiva with erythema, slight eyelid upper and  lower bilat puffiness nontender, no obvious d/c Neck: Normal range of motion. Neck supple. No adenopathy Cardiovascular: Normal rate and regular rhythm.   Pulmonary/Chest: Effort normal and breath sounds normal.  Abd:  Soft, NT, non-distended, + BS, benign Neurological: Pt is alert. No cranial nerve deficit.  Skin: Skin is warm. No erythema.  Psychiatric: Pt behavior is normal. Thought content normal. 1+ nervous    Assessment & Plan:

## 2011-04-15 NOTE — Assessment & Plan Note (Signed)
stable overall by hx and exam, most recent data reviewed with pt, and pt to continue medical treatment as before  Lab Results  Component Value Date   HGBA1C 5.9 04/05/2011

## 2011-04-15 NOTE — Assessment & Plan Note (Signed)
stable overall by hx and exam, most recent data reviewed with pt, and pt to continue medical treatment as before  BP Readings from Last 3 Encounters:  04/14/11 124/68  04/05/11 122/82  12/07/10 110/64

## 2011-05-30 ENCOUNTER — Ambulatory Visit (INDEPENDENT_AMBULATORY_CARE_PROVIDER_SITE_OTHER): Payer: Medicare Other

## 2011-05-30 DIAGNOSIS — Z23 Encounter for immunization: Secondary | ICD-10-CM

## 2011-06-08 ENCOUNTER — Ambulatory Visit (INDEPENDENT_AMBULATORY_CARE_PROVIDER_SITE_OTHER): Payer: Medicare Other | Admitting: Internal Medicine

## 2011-06-08 ENCOUNTER — Encounter: Payer: Self-pay | Admitting: Internal Medicine

## 2011-06-08 ENCOUNTER — Ambulatory Visit (INDEPENDENT_AMBULATORY_CARE_PROVIDER_SITE_OTHER)
Admission: RE | Admit: 2011-06-08 | Discharge: 2011-06-08 | Disposition: A | Discharge status: Home / Self Care | Payer: Medicare Other | Source: Ambulatory Visit | Attending: Internal Medicine | Admitting: Internal Medicine

## 2011-06-08 VITALS — BP 122/78 | HR 76 | Temp 97.5°F | Resp 16 | Wt 155.0 lb

## 2011-06-08 DIAGNOSIS — M79609 Pain in unspecified limb: Secondary | ICD-10-CM

## 2011-06-08 DIAGNOSIS — M5430 Sciatica, unspecified side: Secondary | ICD-10-CM

## 2011-06-08 DIAGNOSIS — M79651 Pain in right thigh: Secondary | ICD-10-CM

## 2011-06-08 DIAGNOSIS — M5431 Sciatica, right side: Secondary | ICD-10-CM | POA: Insufficient documentation

## 2011-06-08 HISTORY — DX: Pain in right thigh: M79.651

## 2011-06-08 HISTORY — DX: Sciatica, right side: M54.31

## 2011-06-08 MED ORDER — METHOCARBAMOL 500 MG PO TABS: 500.0000 mg | ORAL_TABLET | Freq: Four times a day (QID) | ORAL | Status: AC

## 2011-06-08 MED ORDER — NAPROXEN 375 MG PO TABS: 375.0000 mg | ORAL_TABLET | Freq: Two times a day (BID) | ORAL | Status: DC

## 2011-06-08 NOTE — Progress Notes (Signed)
  Subjective:    Patient ID: Adrian [name], male    DOB: 13-Apr-1941, 70 y.o.   MRN: [id number]  HPI He returns c/o "charlie horse" pain in his right hip and thigh for several days with no prior trauma or injury. It hurts for him to sit for long periods of time, he has not taken anything for the pain. He does not have any low back pain and there is no N/W/T in his legs or feet.   Review of Systems  Constitutional: Negative.   HENT: Negative.   Eyes: Negative.   Respiratory: Negative.   Cardiovascular: Negative.   Gastrointestinal: Negative.   Genitourinary: Negative.   Musculoskeletal: Positive for arthralgias. Negative for myalgias, back pain, joint swelling and gait problem.  Neurological: Negative for dizziness, tremors, seizures, syncope, facial asymmetry, speech difficulty, weakness, light-headedness, numbness and headaches.  Hematological: Negative for adenopathy. Does not bruise/bleed easily.  Psychiatric/Behavioral: Negative.        Objective:   Physical Exam  Vitals reviewed. Constitutional: He is oriented to person, place, and time. He appears well-developed and well-nourished. No distress.  HENT:  Head: Normocephalic and atraumatic.  Mouth/Throat: No oropharyngeal exudate.  Eyes: Conjunctivae are normal. Right eye exhibits no discharge. Left eye exhibits no discharge. No scleral icterus.  Neck: Normal range of motion. Neck supple. No JVD present. No tracheal deviation present. No thyromegaly present.  Cardiovascular: Normal rate, regular rhythm, normal heart sounds and intact distal pulses.  Exam reveals no gallop and no friction rub.   No murmur heard. Pulmonary/Chest: Effort normal and breath sounds normal. No stridor. No respiratory distress. He has no wheezes. He has no rales. He exhibits no tenderness.  Abdominal: Soft. Bowel sounds are normal. He exhibits no distension and no mass. There is no hepatosplenomegaly. There is no tenderness. There is no rebound, no  guarding and no CVA tenderness.  Musculoskeletal: Normal range of motion. He exhibits no edema and no tenderness.       Right hip: Normal. He exhibits normal range of motion, normal strength, no tenderness, no bony tenderness and no swelling.       Lumbar back: Normal. He exhibits normal range of motion, no tenderness, no bony tenderness, no swelling, no edema, no deformity and no pain.  Lymphadenopathy:    He has no cervical adenopathy.  Neurological: He is oriented to person, place, and time.  Skin: Skin is warm and dry. No rash noted. He is not diaphoretic. No erythema. No pallor.  Psychiatric: He has a normal mood and affect. His behavior is normal. Judgment and thought content normal.      Lab Results  Component Value Date   WBC 8.0 04/05/2011   HGB 14.4 04/05/2011   HCT 43.0 04/05/2011   PLT 180.0 04/05/2011   GLUCOSE 82 04/05/2011   CHOL 139 04/05/2011   TRIG 59.0 04/05/2011   HDL 58.00 04/05/2011   LDLDIRECT 116.6 11/10/2010   LDLCALC 69 04/05/2011   ALT 25 04/05/2011   AST 21 04/05/2011   NA 140 04/05/2011   K 3.7 04/05/2011   CL 102 04/05/2011   CREATININE 0.7 04/05/2011   BUN 20 04/05/2011   CO2 31 04/05/2011   TSH 3.68 04/05/2011   PSA 1.68 05/18/2010   HGBA1C 5.9 04/05/2011   MICROALBUR 0.9 09/14/2009      Assessment & Plan:

## 2011-06-08 NOTE — Patient Instructions (Signed)

## 2011-06-09 NOTE — Assessment & Plan Note (Signed)
I will start muscle relaxers and nsaids and will see if that helps. He was also given pt ed material about sciatica

## 2011-06-09 NOTE — Assessment & Plan Note (Signed)
I will check an xray to look for djd, spurring, occult fracture, etc.

## 2011-07-19 ENCOUNTER — Ambulatory Visit (INDEPENDENT_AMBULATORY_CARE_PROVIDER_SITE_OTHER): Payer: Medicare Other | Admitting: Internal Medicine

## 2011-07-19 ENCOUNTER — Encounter: Payer: Self-pay | Admitting: Internal Medicine

## 2011-07-19 ENCOUNTER — Other Ambulatory Visit (INDEPENDENT_AMBULATORY_CARE_PROVIDER_SITE_OTHER): Payer: Medicare Other

## 2011-07-19 DIAGNOSIS — E7800 Pure hypercholesterolemia, unspecified: Secondary | ICD-10-CM

## 2011-07-19 DIAGNOSIS — I10 Essential (primary) hypertension: Secondary | ICD-10-CM

## 2011-07-19 DIAGNOSIS — E781 Pure hyperglyceridemia: Secondary | ICD-10-CM

## 2011-07-19 DIAGNOSIS — E119 Type 2 diabetes mellitus without complications: Secondary | ICD-10-CM

## 2011-07-19 LAB — LIPID PANEL
HDL: 50.9 mg/dL (ref >39.00)
Total CHOL/HDL Ratio: 3
Triglycerides: 71 mg/dL (ref 0.0–149.0)

## 2011-07-19 LAB — COMPREHENSIVE METABOLIC PANEL
AST: 20 U/L (ref 0–37)
Alkaline Phosphatase: 43 U/L (ref 39–117)
BUN: 30 mg/dL — ABNORMAL HIGH (ref 6–23)
Calcium: 9.4 mg/dL (ref 8.4–10.5)
Chloride: 104 mEq/L (ref 96–112)
Creatinine, Ser: 0.8 mg/dL (ref 0.4–1.5)

## 2011-07-19 NOTE — Assessment & Plan Note (Signed)
I will recheck his FLP and will monitor his LFT's

## 2011-07-19 NOTE — Progress Notes (Signed)
Subjective:    Patient ID: Adrian [name], male    DOB: 1941/07/30, 70 y.o.   MRN: [id number]  Diabetes He presents for his follow-up diabetic visit. He has type 2 diabetes mellitus. His disease course has been stable. There are no hypoglycemic associated symptoms. Pertinent negatives for diabetes include no blurred vision, no chest pain, no fatigue, no foot paresthesias, no foot ulcerations, no polydipsia, no polyphagia, no polyuria, no visual change, no weakness and no weight loss. There are no hypoglycemic complications. Symptoms are stable. There are no diabetic complications. Current diabetic treatment includes oral agent (dual therapy). He is compliant with treatment all of the time. His weight is stable. He is following a generally healthy diet. Meal planning includes avoidance of concentrated sweets. He has not had a previous visit with a dietician. He participates in exercise intermittently. There is no change in his home blood glucose trend. His breakfast blood glucose range is generally 90-110 mg/dl. His lunch blood glucose range is generally 90-110 mg/dl. His dinner blood glucose range is generally 110-130 mg/dl. His highest blood glucose is 110-130 mg/dl. His overall blood glucose range is 90-110 mg/dl. An ACE inhibitor/angiotensin II receptor blocker is being taken. He does not see a podiatrist.Eye exam is current.  Hyperlipidemia This is a chronic problem. The current episode started more than 1 year ago. The problem is controlled. Recent lipid tests were reviewed and are variable. Exacerbating diseases include diabetes. He has no history of chronic renal disease, hypothyroidism, liver disease, obesity or nephrotic syndrome. Factors aggravating his hyperlipidemia include no known factors. Pertinent negatives include no chest pain, focal sensory loss, focal weakness, leg pain, myalgias or shortness of breath. Current antihyperlipidemic treatment includes statins and bile acid squestrants. The  current treatment provides moderate improvement of lipids. Compliance problems include adherence to exercise and adherence to diet.       Review of Systems  Constitutional: Negative.  Negative for weight loss and fatigue.  HENT: Negative.   Eyes: Negative.  Negative for blurred vision.  Respiratory: Negative.  Negative for shortness of breath.   Cardiovascular: Negative.  Negative for chest pain.  Gastrointestinal: Negative.   Genitourinary: Negative.  Negative for polyuria.  Musculoskeletal: Negative.  Negative for myalgias.  Skin: Negative.   Neurological: Negative.  Negative for focal weakness and weakness.  Hematological: Negative.  Negative for polydipsia and polyphagia.  Psychiatric/Behavioral: Negative.        Objective:   Physical Exam  Vitals reviewed. Constitutional: He is oriented to person, place, and time. He appears well-developed and well-nourished. No distress.  HENT:  Head: Normocephalic and atraumatic.  Mouth/Throat: Oropharynx is clear and moist. No oropharyngeal exudate.  Eyes: Conjunctivae are normal. Right eye exhibits no discharge. Left eye exhibits no discharge. No scleral icterus.  Neck: Normal range of motion. Neck supple. No JVD present. No tracheal deviation present. No thyromegaly present.  Cardiovascular: Normal rate, regular rhythm and intact distal pulses.  Exam reveals no gallop and no friction rub.   No murmur heard. Pulmonary/Chest: Effort normal and breath sounds normal. No stridor. No respiratory distress. He has no wheezes. He has no rales. He exhibits no tenderness.  Abdominal: Soft. Bowel sounds are normal. He exhibits no distension and no mass. There is no tenderness. There is no rebound and no guarding.  Musculoskeletal: Normal range of motion. He exhibits no edema and no tenderness.  Lymphadenopathy:    He has no cervical adenopathy.  Neurological: He is oriented to person, place, and time.  Skin:  Skin is warm and dry. No rash noted. He  is not diaphoretic. No erythema. No pallor.  Psychiatric: He has a normal mood and affect. His behavior is normal. Judgment and thought content normal.      Lab Results  Component Value Date   WBC 8.0 04/05/2011   HGB 14.4 04/05/2011   HCT 43.0 04/05/2011   PLT 180.0 04/05/2011   GLUCOSE 82 04/05/2011   CHOL 139 04/05/2011   TRIG 59.0 04/05/2011   HDL 58.00 04/05/2011   LDLDIRECT 116.6 11/10/2010   LDLCALC 69 04/05/2011   ALT 25 04/05/2011   AST 21 04/05/2011   NA 140 04/05/2011   K 3.7 04/05/2011   CL 102 04/05/2011   CREATININE 0.7 04/05/2011   BUN 20 04/05/2011   CO2 31 04/05/2011   TSH 3.68 04/05/2011   PSA 1.68 05/18/2010   HGBA1C 5.9 04/05/2011   MICROALBUR 0.9 09/14/2009      Assessment & Plan:

## 2011-07-19 NOTE — Assessment & Plan Note (Signed)
Check FLP today. 

## 2011-07-19 NOTE — Assessment & Plan Note (Signed)
It sounds like his blood sugar is well controlled, I will check his a1c today and will monitor his renal function

## 2011-07-19 NOTE — Patient Instructions (Signed)

## 2011-07-19 NOTE — Assessment & Plan Note (Signed)
His BP is well controlled 

## 2011-08-04 ENCOUNTER — Ambulatory Visit (INDEPENDENT_AMBULATORY_CARE_PROVIDER_SITE_OTHER): Payer: Medicare Other | Admitting: Internal Medicine

## 2011-08-04 ENCOUNTER — Encounter: Payer: Self-pay | Admitting: Internal Medicine

## 2011-08-04 VITALS — BP 130/80 | HR 67 | Temp 99.8°F | Wt 149.0 lb

## 2011-08-04 DIAGNOSIS — J069 Acute upper respiratory infection, unspecified: Secondary | ICD-10-CM

## 2011-08-04 MED ORDER — HYDROCOD POLST-CHLORPHEN POLST 10-8 MG/5ML PO LQCR: 5.0000 mL | Freq: Two times a day (BID) | ORAL | Status: DC | PRN

## 2011-08-04 NOTE — Progress Notes (Signed)
  Subjective:    HPI  complains of cold symptoms  Onset 48h ago, progressive symptoms  associated with dry cough, sore throat, mild headache and fever Also myalgias, but no chest congestion No relief with OTC meds Precipitated by sick contacts  Past Medical History  Diagnosis Date  . DIABETES MELLITUS   . HYPERCHOLESTEROLEMIA   . ERECTILE DYSFUNCTION   . Chronic angle-closure glaucoma   . ESSENTIAL HYPERTENSION   . GASTRITIS   . RENAL CYST   . Helicobacter pylori (H. pylori)   . Hx of colonic polyps     Review of Systems Constitutional: No night sweats, no unexpected weight change Pulmonary: No pleurisy or hemoptysis Cardiovascular: No chest pain or palpitations     Objective:   Physical Exam BP 130/80  Pulse 67  Temp(Src) 99.8 F (37.7 C) (Oral)  Wt 149 lb (67.586 kg)  SpO2 99% GEN: mildly ill appearing but no audible head/chest congestion - dry coughing HENT: NCAT, no sinus tenderness bilaterally, nares with clear discharge, oropharynx mild erythema, no exudate Eyes: Vision grossly intact, no conjunctivitis Lungs: Clear to auscultation without rhonchi or wheeze, no increased work of breathing Cardiovascular: Regular rate and rhythm, no bilateral edema      Assessment & Plan:  Viral URI  Cough, postnasal drip related to above   Explained lack of efficacy for antibiotics in viral disease Prescription cough suppression - new prescriptions done Symptomatic care with Tylenol or Advil, hydration and rest -  salt gargle advised as needed

## 2011-08-04 NOTE — Patient Instructions (Signed)
It was good to see you today. If you develop worsening symptoms or fever, call and we can reconsider antibiotics, but it does not appear necessary to use antibiotics at this time. Alternate between ibuprofen and tylenol for aches, pain and fever symptoms as discussed Use Tussionex as needed for cough - Your prescription(s) have been submitted to your pharmacy. Please take as directed and contact our office if you believe you are having problem(s) with the medication(s). Hydrated, and get plenty of rest and call us if symptoms worse or unimproved in next 3-5 days

## 2011-08-11 ENCOUNTER — Other Ambulatory Visit: Payer: Self-pay | Admitting: Internal Medicine

## 2011-09-11 ENCOUNTER — Other Ambulatory Visit: Payer: Self-pay | Admitting: Internal Medicine

## 2011-10-07 ENCOUNTER — Other Ambulatory Visit: Payer: Self-pay | Admitting: Internal Medicine

## 2011-11-08 ENCOUNTER — Encounter: Payer: Self-pay | Admitting: *Deleted

## 2011-11-09 ENCOUNTER — Other Ambulatory Visit (INDEPENDENT_AMBULATORY_CARE_PROVIDER_SITE_OTHER): Payer: Medicare Other

## 2011-11-09 ENCOUNTER — Other Ambulatory Visit: Payer: Self-pay

## 2011-11-09 ENCOUNTER — Ambulatory Visit (INDEPENDENT_AMBULATORY_CARE_PROVIDER_SITE_OTHER): Payer: Medicare Other | Admitting: Gastroenterology

## 2011-11-09 ENCOUNTER — Encounter: Payer: Self-pay | Admitting: Gastroenterology

## 2011-11-09 DIAGNOSIS — E119 Type 2 diabetes mellitus without complications: Secondary | ICD-10-CM

## 2011-11-09 DIAGNOSIS — R109 Unspecified abdominal pain: Secondary | ICD-10-CM

## 2011-11-09 DIAGNOSIS — Z79899 Other long term (current) drug therapy: Secondary | ICD-10-CM

## 2011-11-09 LAB — CBC WITH DIFFERENTIAL/PLATELET
Basophils Absolute: 0 10*3/uL (ref 0.0–0.1)
Eosinophils Absolute: 0.1 10*3/uL (ref 0.0–0.7)
Hemoglobin: 15 g/dL (ref 13.0–17.0)
Lymphocytes Relative: 32.8 % (ref 12.0–46.0)
MCHC: 33.9 g/dL (ref 30.0–36.0)
Monocytes Relative: 13.5 % — ABNORMAL HIGH (ref 3.0–12.0)
Neutro Abs: 2.5 10*3/uL (ref 1.4–7.7)
Neutrophils Relative %: 50.2 % (ref 43.0–77.0)
RBC: 4.77 Mil/uL (ref 4.22–5.81)
RDW: 13.9 % (ref 11.5–14.6)

## 2011-11-09 LAB — BASIC METABOLIC PANEL
BUN: 20 mg/dL (ref 6–23)
CO2: 30 mEq/L (ref 19–32)
Calcium: 9.5 mg/dL (ref 8.4–10.5)
GFR: 99.34 mL/min (ref >60.00)
Glucose, Bld: 106 mg/dL — ABNORMAL HIGH (ref 70–99)

## 2011-11-09 LAB — HEPATIC FUNCTION PANEL
ALT: 31 U/L (ref 0–53)
Albumin: 4 g/dL (ref 3.5–5.2)
Bilirubin, Direct: 0.1 mg/dL (ref 0.0–0.3)
Total Protein: 6.6 g/dL (ref 6.0–8.3)

## 2011-11-09 LAB — VITAMIN B12: Vitamin B-12: 674 pg/mL (ref 211–911)

## 2011-11-09 LAB — IBC PANEL: Iron: 101 ug/dL (ref 42–165)

## 2011-11-09 LAB — FOLATE

## 2011-11-09 LAB — HIGH SENSITIVITY CRP: CRP, High Sensitivity: 1.84 mg/L (ref 0.000–5.000)

## 2011-11-09 MED ORDER — GLUCOSE BLOOD VI STRP: ORAL_STRIP | Status: DC

## 2011-11-09 NOTE — Progress Notes (Signed)
This is a 71 year old [place] American who has lived in the [place] for 25 years. He has chronic, chronic diffuse abdominal pain of unexplained etiology. His pain is constant ever since her abdomen without any alleviating or precipitating elements. He otherwise denies any GI complaints or systemic complaints. His appetite is good and his weight is stable, and he denies any food intolerances. He has had CT scans, colonoscopy, endoscopy, all unremarkable. He denies dyspepsia, reflux symptoms or dysphagia. Is on multiple medications listed and reviewed his records. Attempted acupuncture to relieve his pain have been unsuccessful. He currently is on tramadol and when necessary Tussionex.  Current Medications, Allergies, Past Medical History, Past Surgical History, Family History and Social History were reviewed in [employer] record.  Pertinent Review of Systems Negative   Physical Exam: Healthy-appearing patient in no distress. Blood pressure 104/60 and pulse 64 and regular. BMI 27.33. I cannot appreciate stigmata of chronic liver disease. Abdominal exam is entirely benign without organomegaly, masses, or localized tenderness. Bowel sounds are normal. Rectal exam shows no masses or tenderness or prostate enlargement. Stool is formed and guaiac negative.    Assessment and Plan: Chronic abdominal pain of unexplained etiology. I have referred him to [facility] pain management clinic for a disciplined and multi-prong approach to his abdominal pain management. I did repeat some basic screening laboratory parameters, but do not think he needs repeat endoscopy, colonoscopy, or CT scans. I have made no change in his medications as listed and reviewed. Encounter Diagnosis  Name Primary?  . Abdominal pain Yes

## 2011-11-09 NOTE — Patient Instructions (Signed)
Please go to the basement today for your labs.  We will fax your records to [facility], once they review them someone will contact you with an appointment.

## 2011-11-13 LAB — CELIAC PANEL 10
Endomysial Screen: NEGATIVE
Gliadin IgA: 1.8 U/mL (ref <20)
Gliadin IgG: 6.1 U/mL (ref <20)
IgA: 153 mg/dL (ref 68–379)
Tissue Transglutaminase Ab, IgA: 2.6 U/mL (ref <20)

## 2011-11-14 ENCOUNTER — Other Ambulatory Visit: Payer: Self-pay | Admitting: Gastroenterology

## 2011-11-17 ENCOUNTER — Ambulatory Visit: Payer: Medicare Other | Admitting: Internal Medicine

## 2011-11-21 ENCOUNTER — Telehealth: Payer: Self-pay | Admitting: Gastroenterology

## 2011-11-21 DIAGNOSIS — R748 Abnormal levels of other serum enzymes: Secondary | ICD-10-CM

## 2011-11-21 NOTE — Telephone Encounter (Signed)
Mrs Adrian [name] reports they were out of town when we called and just received our letter. CT scheduled for 11/24/11 at 1:30pm. CT instructions, map and contrast will be left at the front desk. cautioned her about pt stopping the diabetes med: KOMBIGLYZE. She stated understanding.

## 2011-11-23 ENCOUNTER — Telehealth: Payer: Self-pay | Admitting: *Deleted

## 2011-11-23 NOTE — Telephone Encounter (Signed)
Pt walked into the office with the letter I mailed to him about the need for a CT scan. Explained to pt I ordered the scan with Ms. Tincher; pt stated she is his fiance. His contrast and instructions for the scan scheduled for tomorrow were still up front. Pt states his daughter is going to a hospital in [place] and he needs to see about her. Pt will call to r/s his scan; took the contrast; notified Rose to put the scan on hold.

## 2011-11-24 ENCOUNTER — Other Ambulatory Visit: Payer: Medicare Other

## 2011-11-28 ENCOUNTER — Telehealth: Payer: Self-pay | Admitting: *Deleted

## 2011-11-28 NOTE — Telephone Encounter (Signed)
Called [facility] and they state they called patient to make him an appt on 11/21/2011 and the GF says that the patient does not want to schedule an appt for the patient until after he has had his CT scan completed and as far as I can tell the CT scan is not scheduled yet, patient is going to call back to schedule it. The patient has the pain clinics number and will call them directly to make the his Pain clinic appt.

## 2011-11-29 ENCOUNTER — Telehealth: Payer: Self-pay | Admitting: *Deleted

## 2011-11-29 NOTE — Telephone Encounter (Signed)
lmom for pt to call back; he needs to r/s his CT he cancelled.

## 2011-11-30 NOTE — Telephone Encounter (Signed)
Pt's Significant Other, Karena Addison called to r/s his CT sca.. She will call Rose directly to schedule [phone]

## 2011-12-07 ENCOUNTER — Ambulatory Visit (INDEPENDENT_AMBULATORY_CARE_PROVIDER_SITE_OTHER)
Admission: RE | Admit: 2011-12-07 | Discharge: 2011-12-07 | Disposition: A | Discharge status: Home / Self Care | Payer: Medicare Other | Source: Ambulatory Visit | Attending: Gastroenterology | Admitting: Gastroenterology

## 2011-12-07 DIAGNOSIS — R748 Abnormal levels of other serum enzymes: Secondary | ICD-10-CM

## 2011-12-07 MED ORDER — IOHEXOL 300 MG/ML  SOLN
100.0000 mL | Freq: Once | INTRAMUSCULAR | Status: AC | PRN
  Administered 2011-12-07: 100 mL via INTRAVENOUS

## 2011-12-18 ENCOUNTER — Other Ambulatory Visit: Payer: Self-pay | Admitting: Internal Medicine

## 2012-01-08 ENCOUNTER — Ambulatory Visit (INDEPENDENT_AMBULATORY_CARE_PROVIDER_SITE_OTHER): Payer: Medicare Other | Admitting: Internal Medicine

## 2012-01-08 ENCOUNTER — Ambulatory Visit (INDEPENDENT_AMBULATORY_CARE_PROVIDER_SITE_OTHER)
Admission: RE | Admit: 2012-01-08 | Discharge: 2012-01-08 | Disposition: A | Discharge status: Home / Self Care | Payer: Medicare Other | Source: Ambulatory Visit | Attending: Internal Medicine | Admitting: Internal Medicine

## 2012-01-08 ENCOUNTER — Encounter: Payer: Self-pay | Admitting: Internal Medicine

## 2012-01-08 VITALS — BP 120/80 | HR 72 | Temp 98.5°F | Resp 20 | Wt 146.0 lb

## 2012-01-08 DIAGNOSIS — M1990 Unspecified osteoarthritis, unspecified site: Secondary | ICD-10-CM | POA: Insufficient documentation

## 2012-01-08 DIAGNOSIS — M79609 Pain in unspecified limb: Secondary | ICD-10-CM

## 2012-01-08 DIAGNOSIS — M79671 Pain in right foot: Secondary | ICD-10-CM

## 2012-01-08 DIAGNOSIS — E781 Pure hyperglyceridemia: Secondary | ICD-10-CM

## 2012-01-08 DIAGNOSIS — M79644 Pain in right finger(s): Secondary | ICD-10-CM

## 2012-01-08 DIAGNOSIS — E119 Type 2 diabetes mellitus without complications: Secondary | ICD-10-CM

## 2012-01-08 DIAGNOSIS — M722 Plantar fascial fibromatosis: Secondary | ICD-10-CM

## 2012-01-08 DIAGNOSIS — E7800 Pure hypercholesterolemia, unspecified: Secondary | ICD-10-CM

## 2012-01-08 DIAGNOSIS — I10 Essential (primary) hypertension: Secondary | ICD-10-CM

## 2012-01-08 HISTORY — DX: Plantar fascial fibromatosis: M72.2

## 2012-01-08 HISTORY — DX: Unspecified osteoarthritis, unspecified site: M19.90

## 2012-01-08 LAB — HM DIABETES FOOT EXAM: HM Diabetic Foot Exam: NORMAL

## 2012-01-08 MED ORDER — TRAMADOL HCL 50 MG PO TABS: 50.0000 mg | ORAL_TABLET | Freq: Four times a day (QID) | ORAL | Status: DC | PRN

## 2012-01-08 MED ORDER — COLESEVELAM HCL 3.75 G PO PACK: 1.0000 | PACK | Freq: Every day | ORAL | Status: DC

## 2012-01-08 NOTE — Patient Instructions (Signed)
Diabetes, Type 2 Diabetes is a long-lasting (chronic) disease. In type 2 diabetes, the pancreas does not make enough insulin (a hormone), and the body does not respond normally to the insulin that is made. This type of diabetes was also previously called adult-onset diabetes. It usually occurs after the age of 40, but it can occur at any age.  CAUSES  Type 2 diabetes happens because the pancreasis not making enough insulin or your body has trouble using the insulin that your pancreas does make properly. SYMPTOMS   Drinking more than usual.   Urinating more than usual.   Blurred vision.   Dry, itchy skin.   Frequent infections.   Feeling more tired than usual (fatigue).  DIAGNOSIS The diagnosis of type 2 diabetes is usually made by one of the following tests:  Fasting blood glucose test. You will not eat for at least 8 hours and then take a blood test.   Random blood glucose test. Your blood glucose (sugar) is checked at any time of the day regardless of when you ate.   Oral glucose tolerance test (OGTT). Your blood glucose is measured after you have not eaten (fasted) and then after you drink a glucose containing beverage.  TREATMENT   Healthy eating.   Exercise.   Medicine, if needed.   Monitoring blood glucose.   Seeing your caregiver regularly.  HOME CARE INSTRUCTIONS   Check your blood glucose at least once a day. More frequent monitoring may be necessary, depending on your medicines and on how well your diabetes is controlled. Your caregiver will advise you.   Take your medicine as directed by your caregiver.   Do not smoke.   Make wise food choices. Ask your caregiver for information. Weight loss can improve your diabetes.   Learn about low blood glucose (hypoglycemia) and how to treat it.   Get your eyes checked regularly.   Have a yearly physical exam. Have your blood pressure checked and your blood and urine tested.   Wear a pendant or bracelet saying  that you have diabetes.   Check your feet every night for cuts, sores, blisters, and redness. Let your caregiver know if you have any problems.  SEEK MEDICAL CARE IF:   You have problems keeping your blood glucose in target range.   You have problems with your medicines.   You have symptoms of an illness that do not improve after 24 hours.   You have a sore or wound that is not healing.   You notice a change in vision or a new problem with your vision.   You have a fever.  MAKE SURE YOU:  Understand these instructions.   Will watch your condition.   Will get help right away if you are not doing well or get worse.  Document Released: 07/24/2005 Document Revised: 07/13/2011 Document Reviewed: 01/09/2011 ExitCare Patient Information 2012 [employer].Plantar Fasciitis Plantar fasciitis is a common condition that causes foot pain. It is soreness (inflammation) of the band of tough fibrous tissue on the bottom of the foot that runs from the heel bone (calcaneus) to the ball of the foot. The cause of this soreness may be from excessive standing, poor fitting shoes, running on hard surfaces, being overweight, having an abnormal walk, or overuse (this is common in runners) of the painful foot or feet. It is also common in aerobic exercise dancers and ballet dancers. SYMPTOMS  Most people with plantar fasciitis complain of:  Severe pain in the morning on   the bottom of their foot especially when taking the first steps out of bed. This pain recedes after a few minutes of walking.   Severe pain is experienced also during walking following a long period of inactivity.   Pain is worse when walking barefoot or up stairs  DIAGNOSIS   Your caregiver will diagnose this condition by examining and feeling your foot.   Special tests such as X-rays of your foot, are usually not needed.  PREVENTION   Consult a sports medicine professional before beginning a new exercise program.   Walking  programs offer a good workout. With walking there is a lower chance of overuse injuries common to runners. There is less impact and less jarring of the joints.   Begin all new exercise programs slowly. If problems or pain develop, decrease the amount of time or distance until you are at a comfortable level.   Wear good shoes and replace them regularly.   Stretch your foot and the heel cords at the back of the ankle (Achilles tendon) both before and after exercise.   Run or exercise on even surfaces that are not hard. For example, asphalt is better than pavement.   Do not run barefoot on hard surfaces.   If using a treadmill, vary the incline.   Do not continue to workout if you have foot or joint problems. Seek professional help if they do not improve.  HOME CARE INSTRUCTIONS   Avoid activities that cause you pain until you recover.   Use ice or cold packs on the problem or painful areas after working out.   Only take over-the-counter or prescription medicines for pain, discomfort, or fever as directed by your caregiver.   Soft shoe inserts or athletic shoes with air or gel sole cushions may be helpful.   If problems continue or become more severe, consult a sports medicine caregiver or your own health care provider. Cortisone is a potent anti-inflammatory medication that may be injected into the painful area. You can discuss this treatment with your caregiver.  MAKE SURE YOU:   Understand these instructions.   Will watch your condition.   Will get help right away if you are not doing well or get worse.  Document Released: 04/18/2001 Document Revised: 07/13/2011 Document Reviewed: 06/17/2008 ExitCare Patient Information 2012 [employer]. 

## 2012-01-08 NOTE — Assessment & Plan Note (Signed)
I will check his FLP today 

## 2012-01-08 NOTE — Assessment & Plan Note (Signed)
His BP is well controlled, I will check his lytes and renal function today 

## 2012-01-08 NOTE — Assessment & Plan Note (Signed)
I will check plain films to look for djd, spurs, etc. He will continue the current meds for pain.

## 2012-01-08 NOTE — Assessment & Plan Note (Signed)
I will check his A1C to see if his blood sugars are well controlled

## 2012-01-08 NOTE — Assessment & Plan Note (Signed)
I will check for myopathy with a CPK level and will look at his FLP CMP as well

## 2012-01-08 NOTE — Progress Notes (Signed)
Subjective:    Patient ID: Adrian [name], male    DOB: March 13, 1941, 71 y.o.   MRN: [id number]  Arthritis Presents for initial visit. The disease course has been worsening. The condition has lasted for 3 months. He complains of pain and stiffness. He reports no joint swelling or joint warmth. Affected location: right index finger and right foot (heel) His pain is at a severity of 4/10. Pertinent negatives include no diarrhea, dry eyes, dry mouth, dysuria, fatigue, fever, pain at night, pain while resting, rash, Raynaud's syndrome, uveitis or weight loss. His past medical history is significant for osteoarthritis. There is no history of chronic back pain, lupus, psoriasis or rheumatoid arthritis.  Past treatments include acetaminophen and NSAIDs (tramadol). The treatment provided no relief. Factors aggravating his arthritis include activity. Compliance with prior treatments has been poor. Prior compliance problems include difficulty understanding directions.  Diabetes He presents for his follow-up diabetic visit. He has type 2 diabetes mellitus. His disease course has been improving. There are no hypoglycemic associated symptoms. Pertinent negatives for hypoglycemia include no pallor. Pertinent negatives for diabetes include no blurred vision, no chest pain, no fatigue, no foot paresthesias, no foot ulcerations, no polydipsia, no polyphagia, no polyuria, no visual change, no weakness and no weight loss. There are no hypoglycemic complications. Symptoms are stable. There are no diabetic complications. He is compliant with treatment all of the time. His weight is stable. He is following a generally healthy diet. Meal planning includes avoidance of concentrated sweets. He has not had a previous visit with a dietician. He never participates in exercise. There is no change in his home blood glucose trend. An ACE inhibitor/angiotensin II receptor blocker is being taken. He does not see a podiatrist.Eye exam is  current.      Review of Systems  Constitutional: Negative for fever, chills, weight loss, diaphoresis, activity change, appetite change, fatigue and unexpected weight change.  HENT: Negative.   Eyes: Negative.  Negative for blurred vision.  Respiratory: Negative for apnea, cough, chest tightness, shortness of breath, wheezing and stridor.   Cardiovascular: Negative for chest pain, palpitations and leg swelling.  Gastrointestinal: Negative for nausea, vomiting, diarrhea, blood in stool and abdominal distention.  Genitourinary: Negative for dysuria, urgency, polyuria, frequency, hematuria, flank pain, decreased urine volume, enuresis, difficulty urinating and genital sores.  Musculoskeletal: Positive for arthralgias, arthritis and stiffness. Negative for myalgias, back pain, joint swelling and gait problem.  Skin: Negative for color change, pallor, rash and wound.  Neurological: Negative.  Negative for weakness.  Hematological: Negative for polydipsia, polyphagia and adenopathy. Does not bruise/bleed easily.  Psychiatric/Behavioral: Negative.        Objective:   Physical Exam  Vitals reviewed. Constitutional: He is oriented to person, place, and time. He appears well-developed and well-nourished. No distress.  HENT:  Head: Normocephalic and atraumatic.  Mouth/Throat: Oropharynx is clear and moist. No oropharyngeal exudate.  Eyes: Conjunctivae are normal. Right eye exhibits no discharge. Left eye exhibits no discharge. No scleral icterus.  Neck: Normal range of motion. Neck supple. No JVD present. No tracheal deviation present. No thyromegaly present.  Cardiovascular: Normal rate, regular rhythm, normal heart sounds and intact distal pulses.  Exam reveals no gallop and no friction rub.   No murmur heard. Pulmonary/Chest: Effort normal and breath sounds normal. No stridor. No respiratory distress. He has no wheezes. He has no rales. He exhibits no tenderness.  Abdominal: Soft. Bowel  sounds are normal. He exhibits no distension and no mass. There is no  tenderness. There is no rebound and no guarding.  Musculoskeletal: Normal range of motion. He exhibits no edema and no tenderness.       Right hand: Normal. He exhibits normal range of motion, no tenderness, no bony tenderness, normal two-point discrimination, normal capillary refill, no deformity, no laceration and no swelling. normal sensation noted. Normal strength noted.       Hands:      Right foot: Normal. He exhibits normal range of motion, no tenderness, no bony tenderness, no swelling, normal capillary refill, no crepitus, no deformity and no laceration.  Lymphadenopathy:    He has no cervical adenopathy.  Neurological: He is alert and oriented to person, place, and time. He has normal reflexes. He displays normal reflexes. No cranial nerve deficit. He exhibits normal muscle tone. Coordination normal.  Skin: Skin is warm and dry. No rash noted. He is not diaphoretic. No erythema. No pallor.  Psychiatric: He has a normal mood and affect. His behavior is normal. Judgment and thought content normal.      Lab Results  Component Value Date   WBC 5.0 11/09/2011   HGB 15.0 11/09/2011   HCT 44.3 11/09/2011   PLT 178.0 11/09/2011   GLUCOSE 106* 11/09/2011   CHOL 173 07/19/2011   TRIG 71.0 07/19/2011   HDL 50.90 07/19/2011   LDLDIRECT 116.6 11/10/2010   LDLCALC 108* 07/19/2011   ALT 31 11/09/2011   AST 22 11/09/2011   NA 140 11/09/2011   K 4.0 11/09/2011   CL 102 11/09/2011   CREATININE 0.8 11/09/2011   BUN 20 11/09/2011   CO2 30 11/09/2011   TSH 3.51 11/09/2011   PSA 1.68 05/18/2010   HGBA1C 5.9 04/05/2011   MICROALBUR 0.9 09/14/2009      Assessment & Plan:

## 2012-01-08 NOTE — Assessment & Plan Note (Signed)
I think he has DJD, I will check a plain film, he will continue taking tramadol for pain

## 2012-01-30 ENCOUNTER — Encounter: Payer: Self-pay | Admitting: Internal Medicine

## 2012-01-30 ENCOUNTER — Ambulatory Visit (INDEPENDENT_AMBULATORY_CARE_PROVIDER_SITE_OTHER): Payer: Medicare Other | Admitting: Internal Medicine

## 2012-01-30 ENCOUNTER — Other Ambulatory Visit (INDEPENDENT_AMBULATORY_CARE_PROVIDER_SITE_OTHER): Payer: Medicare Other

## 2012-01-30 VITALS — BP 120/70 | HR 62 | Temp 97.7°F | Resp 16 | Wt 146.0 lb

## 2012-01-30 DIAGNOSIS — E781 Pure hyperglyceridemia: Secondary | ICD-10-CM

## 2012-01-30 DIAGNOSIS — E119 Type 2 diabetes mellitus without complications: Secondary | ICD-10-CM

## 2012-01-30 DIAGNOSIS — E7800 Pure hypercholesterolemia, unspecified: Secondary | ICD-10-CM

## 2012-01-30 DIAGNOSIS — I10 Essential (primary) hypertension: Secondary | ICD-10-CM

## 2012-01-30 DIAGNOSIS — M722 Plantar fascial fibromatosis: Secondary | ICD-10-CM

## 2012-01-30 LAB — COMPREHENSIVE METABOLIC PANEL
Albumin: 3.9 g/dL (ref 3.5–5.2)
Alkaline Phosphatase: 41 U/L (ref 39–117)
BUN: 23 mg/dL (ref 6–23)
Creatinine, Ser: 0.8 mg/dL (ref 0.4–1.5)
Glucose, Bld: 96 mg/dL (ref 70–99)
Total Bilirubin: 1 mg/dL (ref 0.3–1.2)

## 2012-01-30 LAB — CBC WITH DIFFERENTIAL/PLATELET
Basophils Relative: 0.6 % (ref 0.0–3.0)
Eosinophils Relative: 3.7 % (ref 0.0–5.0)
Hemoglobin: 15.3 g/dL (ref 13.0–17.0)
MCV: 93 fl (ref 78.0–100.0)
Monocytes Absolute: 0.6 10*3/uL (ref 0.1–1.0)
Neutro Abs: 3 10*3/uL (ref 1.4–7.7)
Neutrophils Relative %: 56.7 % (ref 43.0–77.0)
RBC: 4.88 Mil/uL (ref 4.22–5.81)
WBC: 5.3 10*3/uL (ref 4.5–10.5)

## 2012-01-30 LAB — LIPID PANEL
Cholesterol: 160 mg/dL (ref 0–200)
LDL Cholesterol: 75 mg/dL (ref 0–99)
Triglycerides: 84 mg/dL (ref 0.0–149.0)
VLDL: 16.8 mg/dL (ref 0.0–40.0)

## 2012-01-30 NOTE — Assessment & Plan Note (Signed)
His BP is well controlled, I will check his lytes and renal function today 

## 2012-01-30 NOTE — Assessment & Plan Note (Signed)
Despite nsaids and tramadol he is still in pain, his xray showed mild DJD, I think this scenario is c/w plantar fasciitis, I gave him pt ed material and offered a referral to podiatry

## 2012-01-30 NOTE — Progress Notes (Signed)
Subjective:    Patient ID: Adrian [name], male    DOB: 1940-08-17, 71 y.o.   MRN: [id number]  Arthritis Presents for follow-up visit. He complains of pain. He reports no stiffness, joint swelling or joint warmth. The symptoms have been worsening. Affected locations include the right foot. His pain is at a severity of 2/10. Pertinent negatives include no diarrhea, fatigue, fever, pain at night, pain while resting, rash or weight loss. Compliance with total regimen is 76-100%. Compliance with medications is 76-100%.  Diabetes He presents for his follow-up diabetic visit. He has type 2 diabetes mellitus. His disease course has been stable. There are no hypoglycemic associated symptoms. Pertinent negatives for hypoglycemia include no pallor. Pertinent negatives for diabetes include no blurred vision, no chest pain, no fatigue, no foot paresthesias, no foot ulcerations, no polydipsia, no polyphagia, no polyuria, no visual change, no weakness and no weight loss. There are no hypoglycemic complications. Symptoms are stable. There are no diabetic complications. Current diabetic treatment includes oral agent (dual therapy). He is compliant with treatment all of the time. His weight is stable. He is following a generally healthy diet. Meal planning includes avoidance of concentrated sweets. He participates in exercise intermittently. There is no change in his home blood glucose trend. An ACE inhibitor/angiotensin II receptor blocker is being taken. He does not see a podiatrist.Eye exam is current.      Review of Systems  Constitutional: Negative for fever, chills, weight loss, diaphoresis, activity change, appetite change, fatigue and unexpected weight change.  HENT: Negative.   Eyes: Negative.  Negative for blurred vision.  Respiratory: Negative for cough, chest tightness, shortness of breath, wheezing and stridor.   Cardiovascular: Negative for chest pain, palpitations and leg swelling.    Gastrointestinal: Negative for nausea, vomiting, abdominal pain, diarrhea, constipation and blood in stool.  Genitourinary: Negative.  Negative for polyuria.  Musculoskeletal: Positive for arthralgias (pain on the bottom of his right heel) and arthritis. Negative for myalgias, back pain, joint swelling, gait problem and stiffness.  Skin: Negative for color change, pallor, rash and wound.  Neurological: Negative.  Negative for weakness.  Hematological: Negative for polydipsia, polyphagia and adenopathy. Does not bruise/bleed easily.       Objective:   Physical Exam  Vitals reviewed. Constitutional: He is oriented to person, place, and time. He appears well-developed and well-nourished. No distress.  HENT:  Head: Normocephalic and atraumatic.  Nose: Nose normal.  Mouth/Throat: Oropharynx is clear and moist. No oropharyngeal exudate.  Eyes: Conjunctivae are normal. Right eye exhibits no discharge. Left eye exhibits no discharge. No scleral icterus.  Neck: Normal range of motion. Neck supple. No JVD present. No tracheal deviation present. No thyromegaly present.  Cardiovascular: Normal rate, regular rhythm, normal heart sounds and intact distal pulses.  Exam reveals no gallop and no friction rub.   No murmur heard. Pulmonary/Chest: Effort normal and breath sounds normal. No stridor. No respiratory distress. He has no wheezes. He has no rales. He exhibits no tenderness.  Abdominal: Soft. Bowel sounds are normal. He exhibits no distension and no mass. There is no tenderness. There is no rebound and no guarding.  Musculoskeletal: Normal range of motion. He exhibits no edema and no tenderness.       Right foot: He exhibits tenderness (over the plantar fascia insertion at the calcaneous). He exhibits normal range of motion, no bony tenderness, no swelling, normal capillary refill, no crepitus, no deformity and no laceration.  Lymphadenopathy:    He has no cervical  adenopathy.  Neurological: He is  oriented to person, place, and time.  Skin: Skin is warm and dry. No rash noted. He is not diaphoretic. No erythema. No pallor.  Psychiatric: He has a normal mood and affect. His behavior is normal. Judgment and thought content normal.      Lab Results  Component Value Date   WBC 5.0 11/09/2011   HGB 15.0 11/09/2011   HCT 44.3 11/09/2011   PLT 178.0 11/09/2011   GLUCOSE 106* 11/09/2011   CHOL 173 07/19/2011   TRIG 71.0 07/19/2011   HDL 50.90 07/19/2011   LDLDIRECT 116.6 11/10/2010   LDLCALC 108* 07/19/2011   ALT 31 11/09/2011   AST 22 11/09/2011   NA 140 11/09/2011   K 4.0 11/09/2011   CL 102 11/09/2011   CREATININE 0.8 11/09/2011   BUN 20 11/09/2011   CO2 30 11/09/2011   TSH 3.51 11/09/2011   PSA 1.68 05/18/2010   HGBA1C 5.9 04/05/2011   MICROALBUR 0.9 09/14/2009     Dg Finger Index Right  01/08/2012  *RADIOLOGY REPORT*  Clinical Data: Index finger pain.  No known injury.  RIGHT INDEX FINGER 2+V  Comparison: None  Findings: No evidence of fracture or dislocation.  Osteoarthritis is seen involving the distal interphalangeal joint greater than the proximal interphalangeal joint.  No other significant bone abnormality identified.  IMPRESSION:  1.  No acute findings. 2.  Osteoarthritis involving the interphalangeal joints.  Original Report Authenticated By: Danae Orleans, M.D.   Dg Foot Complete Right  01/08/2012  *RADIOLOGY REPORT*  Clinical Data: Foot pain.  RIGHT FOOT COMPLETE - 3+ VIEW  Comparison: None.  Findings: Degenerative joint disease changes at the first MTP joint with joint space narrowing and spurring. No acute bony abnormality. Specifically, no fracture, subluxation, or dislocation.  Soft tissues are intact.  IMPRESSION: No acute bony abnormality.  Original Report Authenticated By: Cyndie Chime, M.D.  Assessment & Plan:

## 2012-01-30 NOTE — Assessment & Plan Note (Signed)
I will check his a1c today and will monitor his renal function 

## 2012-01-30 NOTE — Assessment & Plan Note (Signed)
FLP today 

## 2012-01-30 NOTE — Patient Instructions (Signed)
Diabetes, Type 2 Diabetes is a long-lasting (chronic) disease. In type 2 diabetes, the pancreas does not make enough insulin (a hormone), and the body does not respond normally to the insulin that is made. This type of diabetes was also previously called adult-onset diabetes. It usually occurs after the age of 75, but it can occur at any age.  CAUSES  Type 2 diabetes happens because the pancreasis not making enough insulin or your body has trouble using the insulin that your pancreas does make properly. SYMPTOMS   Drinking more than usual.   Urinating more than usual.   Blurred vision.   Dry, itchy skin.   Frequent infections.   Feeling more tired than usual (fatigue).  DIAGNOSIS The diagnosis of type 2 diabetes is usually made by one of the following tests:  Fasting blood glucose test. You will not eat for at least 8 hours and then take a blood test.   Random blood glucose test. Your blood glucose (sugar) is checked at any time of the day regardless of when you ate.   Oral glucose tolerance test (OGTT). Your blood glucose is measured after you have not eaten (fasted) and then after you drink a glucose containing beverage.  TREATMENT   Healthy eating.   Exercise.   Medicine, if needed.   Monitoring blood glucose.   Seeing your caregiver regularly.  HOME CARE INSTRUCTIONS   Check your blood glucose at least once a day. More frequent monitoring may be necessary, depending on your medicines and on how well your diabetes is controlled. Your caregiver will advise you.   Take your medicine as directed by your caregiver.   Do not smoke.   Make wise food choices. Ask your caregiver for information. Weight loss can improve your diabetes.   Learn about low blood glucose (hypoglycemia) and how to treat it.   Get your eyes checked regularly.   Have a yearly physical exam. Have your blood pressure checked and your blood and urine tested.   Wear a pendant or bracelet saying  that you have diabetes.   Check your feet every night for cuts, sores, blisters, and redness. Let your caregiver know if you have any problems.  SEEK MEDICAL CARE IF:   You have problems keeping your blood glucose in target range.   You have problems with your medicines.   You have symptoms of an illness that do not improve after 24 hours.   You have a sore or wound that is not healing.   You notice a change in vision or a new problem with your vision.   You have a fever.  MAKE SURE YOU:  Understand these instructions.   Will watch your condition.   Will get help right away if you are not doing well or get worse.  Document Released: 07/24/2005 Document Revised: 07/13/2011 Document Reviewed: 01/09/2011 [facility] Patient Information 2012 [place], [place].Plantar Fasciitis Plantar fasciitis is a common condition that causes foot pain. It is soreness (inflammation) of the band of tough fibrous tissue on the bottom of the foot that runs from the heel bone (calcaneus) to the ball of the foot. The cause of this soreness may be from excessive standing, poor fitting shoes, running on hard surfaces, being overweight, having an abnormal walk, or overuse (this is common in runners) of the painful foot or feet. It is also common in aerobic exercise dancers and ballet dancers. SYMPTOMS  Most people with plantar fasciitis complain of:  Severe pain in the morning on  the bottom of their foot especially when taking the first steps out of bed. This pain recedes after a few minutes of walking.   Severe pain is experienced also during walking following a long period of inactivity.   Pain is worse when walking barefoot or up stairs  DIAGNOSIS   Your caregiver will diagnose this condition by examining and feeling your foot.   Special tests such as X-rays of your foot, are usually not needed.  PREVENTION   Consult a sports medicine professional before beginning a new exercise program.   Walking  programs offer a good workout. With walking there is a lower chance of overuse injuries common to runners. There is less impact and less jarring of the joints.   Begin all new exercise programs slowly. If problems or pain develop, decrease the amount of time or distance until you are at a comfortable level.   Wear good shoes and replace them regularly.   Stretch your foot and the heel cords at the back of the ankle (Achilles tendon) both before and after exercise.   Run or exercise on even surfaces that are not hard. For example, asphalt is better than pavement.   Do not run barefoot on hard surfaces.   If using a treadmill, vary the incline.   Do not continue to workout if you have foot or joint problems. Seek professional help if they do not improve.  HOME CARE INSTRUCTIONS   Avoid activities that cause you pain until you recover.   Use ice or cold packs on the problem or painful areas after working out.   Only take over-the-counter or prescription medicines for pain, discomfort, or fever as directed by your caregiver.   Soft shoe inserts or athletic shoes with air or gel sole cushions may be helpful.   If problems continue or become more severe, consult a sports medicine caregiver or your own health care provider. Cortisone is a potent anti-inflammatory medication that may be injected into the painful area. You can discuss this treatment with your caregiver.  MAKE SURE YOU:   Understand these instructions.   Will watch your condition.   Will get help right away if you are not doing well or get worse.  Document Released: 04/18/2001 Document Revised: 07/13/2011 Document Reviewed: 06/17/2008 [facility] Patient Information 2012 [place], [place].

## 2012-02-17 ENCOUNTER — Other Ambulatory Visit: Payer: Self-pay | Admitting: Internal Medicine

## 2012-04-10 ENCOUNTER — Ambulatory Visit (INDEPENDENT_AMBULATORY_CARE_PROVIDER_SITE_OTHER): Payer: Medicare Other | Admitting: Internal Medicine

## 2012-04-10 ENCOUNTER — Encounter: Payer: Self-pay | Admitting: Internal Medicine

## 2012-04-10 VITALS — BP 114/64 | HR 58 | Temp 97.7°F | Resp 16 | Wt 149.0 lb

## 2012-04-10 DIAGNOSIS — K4090 Unilateral inguinal hernia, without obstruction or gangrene, not specified as recurrent: Secondary | ICD-10-CM

## 2012-04-10 DIAGNOSIS — B356 Tinea cruris: Secondary | ICD-10-CM | POA: Insufficient documentation

## 2012-04-10 HISTORY — DX: Unilateral inguinal hernia, without obstruction or gangrene, not specified as recurrent: K40.90

## 2012-04-10 HISTORY — DX: Tinea cruris: B35.6

## 2012-04-10 MED ORDER — KETOCONAZOLE 2 % EX CREA: TOPICAL_CREAM | Freq: Two times a day (BID) | CUTANEOUS | Status: DC

## 2012-04-10 MED ORDER — HYDROCODONE-ACETAMINOPHEN 5-500 MG PO TABS: 2.0000 | ORAL_TABLET | Freq: Four times a day (QID) | ORAL | Status: AC | PRN

## 2012-04-10 NOTE — Assessment & Plan Note (Signed)
Treat with ketoconazole cream

## 2012-04-10 NOTE — Assessment & Plan Note (Signed)
Will try vicodin to control the pain and have asked him to see a general surgeon

## 2012-04-10 NOTE — Progress Notes (Signed)
Subjective:    Patient ID: Adrian [name], male    DOB: 1941/06/05, 71 y.o.   MRN: [id number]  Rash This is a new problem. The current episode started 1 to 4 weeks ago. The problem is unchanged. The affected locations include the groin. The rash is characterized by itchiness, dryness and redness. He was exposed to nothing. Pertinent negatives include no anorexia, cough, diarrhea, fatigue, fever, shortness of breath or vomiting. Past treatments include nothing.  Abdominal Pain This is a chronic problem. The current episode started more than 1 year ago. The onset quality is gradual. The problem occurs intermittently. The problem has been unchanged. The pain is located in the suprapubic region. The pain is at a severity of 3/10. The pain is mild. The quality of the pain is aching. The abdominal pain radiates to the scrotum and pelvis. Pertinent negatives include no anorexia, arthralgias, belching, constipation, diarrhea, dysuria, fever, flatus, frequency, headaches, hematochezia, hematuria, melena, myalgias, nausea, vomiting or weight loss. Nothing aggravates the pain. The pain is relieved by nothing. Treatments tried: tramadol. The treatment provided mild relief.      Review of Systems  Constitutional: Negative for fever, chills, weight loss, diaphoresis, activity change, appetite change, fatigue and unexpected weight change.  HENT: Negative.   Eyes: Negative.   Respiratory: Negative for cough, chest tightness, shortness of breath, wheezing and stridor.   Cardiovascular: Negative for chest pain, palpitations and leg swelling.  Gastrointestinal: Positive for abdominal pain. Negative for nausea, vomiting, diarrhea, constipation, blood in stool, melena, hematochezia, abdominal distention, anal bleeding, rectal pain, anorexia and flatus.  Genitourinary: Negative for dysuria, urgency, frequency, hematuria, flank pain, decreased urine volume, discharge, penile swelling, scrotal swelling, enuresis,  difficulty urinating, genital sores, penile pain and testicular pain.  Musculoskeletal: Negative for myalgias, back pain, joint swelling, arthralgias and gait problem.  Skin: Positive for rash. Negative for color change, pallor and wound.  Neurological: Negative for headaches.  Hematological: Negative for adenopathy. Does not bruise/bleed easily.  Psychiatric/Behavioral: Negative.        Objective:   Physical Exam  Vitals reviewed. Constitutional: He is oriented to person, place, and time. He appears well-developed and well-nourished.  Non-toxic appearance. He does not have a sickly appearance. He does not appear ill. No distress.  HENT:  Head: Normocephalic and atraumatic.  Mouth/Throat: Oropharynx is clear and moist. No oropharyngeal exudate.  Eyes: Conjunctivae are normal. Right eye exhibits no discharge. Left eye exhibits no discharge. No scleral icterus.  Neck: Normal range of motion. Neck supple. No JVD present. No tracheal deviation present. No thyromegaly present.  Cardiovascular: Normal rate, regular rhythm, normal heart sounds and intact distal pulses.  Exam reveals no gallop and no friction rub.   No murmur heard. Pulmonary/Chest: Effort normal and breath sounds normal. No stridor. No respiratory distress. He has no wheezes. He has no rales. He exhibits no tenderness.  Abdominal: Soft. Normal appearance and bowel sounds are normal. He exhibits no shifting dullness, no distension, no pulsatile liver, no fluid wave, no abdominal bruit, no ascites, no pulsatile midline mass and no mass. There is no hepatosplenomegaly. There is no tenderness. There is no rigidity, no rebound, no guarding, no CVA tenderness, no tenderness at McBurney's point and negative Murphy's sign. A hernia is present. Hernia confirmed positive in the left inguinal area (it only protrudes with valsalva then it resolves). Hernia confirmed negative in the ventral area and confirmed negative in the right inguinal area.    Musculoskeletal: Normal range of motion. He exhibits no  edema and no tenderness.  Lymphadenopathy:    He has no cervical adenopathy.  Neurological: He is oriented to person, place, and time.  Skin: Skin is warm, dry and intact. Rash noted. No abrasion, no bruising, no burn, no ecchymosis, no laceration, no lesion, no petechiae and no purpura noted. Rash is not macular, not papular, not maculopapular, not nodular, not pustular, not vesicular and not urticarial. He is not diaphoretic. There is erythema. No pallor.     Psychiatric: He has a normal mood and affect. His behavior is normal. Judgment and thought content normal.          Assessment & Plan:

## 2012-04-10 NOTE — Patient Instructions (Addendum)
Jock Itch Jock itch is a fungal infection of the skin in the groin area. It is sometimes called "ringworm" even though it is not caused by a worm. A fungus is a type of germ that thrives in dark, damp places.  CAUSES  This infection may spread from:  A fungus infection elsewhere on the body (such as athlete's foot).   Sharing towels or clothing.  This infection is more common in:  Hot, humid climates.   People who wear tight-fitting clothing or wet bathing suits for long periods of time.   Athletes.   Overweight people.   People with diabetes.  SYMPTOMS  Jock itch causes the following symptoms:  Red, pink or brown rash in the groin. Rash may spread to the thighs, anus, and buttocks.   Itching.  DIAGNOSIS  Your caregiver may make the diagnosis by looking at the rash. Sometimes a skin scraping will be sent to test for fungus. Testing can be done either by looking under the microscope or by doing a culture (test to try to grow the fungus). A culture can take up to 2 weeks to come back. TREATMENT  Jock itch may be treated with:  Skin cream or ointment to kill fungus.   Medicine by mouth to kill fungus.   Skin cream or ointment to calm the itching.   Compresses or medicated powders to dry the infected skin.  HOME CARE INSTRUCTIONS   Be sure to treat the rash completely. Follow your caregiver's instructions. It can take a couple of weeks to treat. If you do not treat the infection long enough, the rash can come back.   Wear loose-fitting clothing.   Men should wear cotton boxer shorts.   Women should wear cotton underwear.   Avoid hot baths.   Dry the groin area well after bathing.  SEEK MEDICAL CARE IF:   Your rash is worse.   Your rash is spreading.   Your rash returns after treatment is finished.   Your rash is not gone in 4 weeks. Fungal infections are slow to respond to treatment. Some redness may remain for several weeks after the fungus is gone.  SEEK  IMMEDIATE MEDICAL CARE IF:  The area becomes red, warm, tender, and swollen.   You have a fever.  Document Released: 07/14/2002 Document Revised: 07/13/2011 Document Reviewed: 06/12/2008 [facility] Patient Information 2012 [place], [place].Inguinal Hernia, Adult Muscles help keep everything in the body in its proper place. But if a weak spot in the muscles develops, something can poke through. That is called a hernia. When this happens in the lower part of the belly (abdomen), it is called an inguinal hernia. (It takes its name from a part of the body in this region called the inguinal canal.) A weak spot in the wall of muscles lets some fat or part of the small intestine bulge through. An inguinal hernia can develop at any age. Men get them more often than women. CAUSES  In adults, an inguinal hernia develops over time.  It can be triggered by:   Suddenly straining the muscles of the lower abdomen.   Lifting heavy objects.   Straining to have a bowel movement. Difficult bowel movements (constipation) can lead to this.   Constant coughing. This may be caused by smoking or lung disease.   Being overweight.   Being pregnant.   Working at a job that requires long periods of standing or heavy lifting.   Having had an inguinal hernia before.  One  type can be an emergency situation. It is called a strangulated inguinal hernia. It develops if part of the small intestine slips through the weak spot and cannot get back into the abdomen. The blood supply can be cut off. If that happens, part of the intestine may die. This situation requires emergency surgery. SYMPTOMS  Often, a small inguinal hernia has no symptoms. It is found when a healthcare provider does a physical exam. Larger hernias usually have symptoms.   In adults, symptoms may include:   A lump in the groin. This is easier to see when the person is standing. It might disappear when lying down.   In men, a lump in the scrotum.    Pain or burning in the groin. This occurs especially when lifting, straining or coughing.   A dull ache or feeling of pressure in the groin.   Signs of a strangulated hernia can include:   A bulge in the groin that becomes very painful and tender to the touch.   A bulge that turns red or purple.   Fever, nausea and vomiting.   Inability to have a bowel movement or to pass gas.  DIAGNOSIS  To decide if you have an inguinal hernia, a healthcare provider will probably do a physical examination.  This will include asking questions about any symptoms you have noticed.   The healthcare provider might feel the groin area and ask you to cough. If an inguinal hernia is felt, the healthcare provider may try to slide it back into the abdomen.   Usually no other tests are needed.  TREATMENT  Treatments can vary. The size of the hernia makes a difference. Options include:  Watchful waiting. This is often suggested if the hernia is small and you have had no symptoms.   No medical procedure will be done unless symptoms develop.   You will need to watch closely for symptoms. If any occur, contact your healthcare provider right away.   Surgery. This is used if the hernia is larger or you have symptoms.   Open surgery. This is usually an outpatient procedure (you will not stay overnight in a hospital). An cut (incision) is made through the skin in the groin. The hernia is put back inside the abdomen. The weak area in the muscles is then repaired by herniorrhaphy or hernioplasty. Herniorrhaphy: in this type of surgery, the weak muscles are sewn back together. Hernioplasty: a patch or mesh is used to close the weak area in the abdominal wall.   Laparoscopy. In this procedure, a surgeon makes small incisions. A thin tube with a tiny video camera (called a laparoscope) is put into the abdomen. The surgeon repairs the hernia with mesh by looking with the video camera and using two long instruments.   HOME CARE INSTRUCTIONS   After surgery to repair an inguinal hernia:   You will need to take pain medicine prescribed by your healthcare provider. Follow all directions carefully.   You will need to take care of the wound from the incision.   Your activity will be restricted for awhile. This will probably include no heavy lifting for several weeks. You also should not do anything too active for a few weeks. When you can return to work will depend on the type of job that you have.   During "watchful waiting" periods, you should:   Maintain a healthy weight.   Eat a diet high in fiber (fruits, vegetables and whole grains).   Drink plenty  of fluids to avoid constipation. This means drinking enough water and other liquids to keep your urine clear or pale yellow.   Do not lift heavy objects.   Do not stand for long periods of time.   Quit smoking. This should keep you from developing a frequent cough.  SEEK MEDICAL CARE IF:   A bulge develops in your groin area.   You feel pain, a burning sensation or pressure in the groin. This might be worse if you are lifting or straining.   You develop a fever of more than 100.5 F (38.1 C).  SEEK IMMEDIATE MEDICAL CARE IF:   Pain in the groin increases suddenly.   A bulge in the groin gets bigger suddenly and does not go down.   For men, there is sudden pain in the scrotum. Or, the size of the scrotum increases.   A bulge in the groin area becomes red or purple and is painful to touch.   You have nausea or vomiting that does not go away.   You feel your heart beating much faster than normal.   You cannot have a bowel movement or pass gas.   You develop a fever of more than 102.0 F (38.9 C).  Document Released: 12/10/2008 Document Revised: 07/13/2011 Document Reviewed: 12/10/2008 [facility] Patient Information 2012 [place], [place].

## 2012-04-24 ENCOUNTER — Ambulatory Visit (INDEPENDENT_AMBULATORY_CARE_PROVIDER_SITE_OTHER): Payer: Medicare Other | Admitting: Surgery

## 2012-04-24 ENCOUNTER — Encounter (HOSPITAL_COMMUNITY): Payer: Self-pay | Admitting: Pharmacy Technician

## 2012-04-24 ENCOUNTER — Encounter (INDEPENDENT_AMBULATORY_CARE_PROVIDER_SITE_OTHER): Payer: Self-pay | Admitting: Surgery

## 2012-04-24 VITALS — BP 134/70 | HR 80 | Temp 97.6°F | Resp 18 | Ht 62.0 in | Wt 148.6 lb

## 2012-04-24 DIAGNOSIS — K4090 Unilateral inguinal hernia, without obstruction or gangrene, not specified as recurrent: Secondary | ICD-10-CM

## 2012-04-24 NOTE — Progress Notes (Signed)
Patient ID: Adrian [name], male   DOB: Nov 28, 1940, 71 y.o.   MRN: [id number]  Chief Complaint  Patient presents with  . New Evaluation    eval L/H    HPI Adrian [name] is a 71 y.o. male.  Referred by Dr. Sanda Linger for evaluation of left inguinal hernia HPI 71 year old male presents with a two-month history of pain and swelling in his left groin. The pain is occasionally fairly severe. The pain improves when he is supine. The patient has had a previous right inguinal hernia repair many years ago. Occasionally has some discomfort in this area but it is not as severe as the left side.The patient is undergoing workup for some periumbilical and epigastric abdominal pain. He has had a recent EGD as well as a colonoscopy which were unremarkable by the patient's report. The groin pain seems to be exacerbated when he is upright or lifting any heavy.  Past Medical History  Diagnosis Date  . DIABETES MELLITUS   . HYPERCHOLESTEROLEMIA   . ERECTILE DYSFUNCTION   . Chronic angle-closure glaucoma   . ESSENTIAL HYPERTENSION   . GASTRITIS   . RENAL CYST   . Helicobacter pylori (H. pylori)   . Personal history of colonic polyps     Past Surgical History  Procedure Date  . Hernia repair   . Left rotator cuff repair   . Right wrist surgery   . Eye surgery     (R) glaucoma (to relieve pressure)  . Cataract extraction     Family History  Problem Relation Age of Onset  . Diabetes Mother   . Diabetes Brother   . Hypertension Other     Social History History  Substance Use Topics  . Smoking status: Former Games developer  . Smokeless tobacco: Never Used   Comment: Stopped 30 years ago  . Alcohol Use: No    Allergies  Allergen Reactions  . Lisinopril     REACTION: cough    Current Outpatient Prescriptions  Medication Sig Dispense Refill  . brimonidine-timolol (COMBIGAN) 0.2-0.5 % ophthalmic solution Place 1 drop into the right eye every 12 (twelve) hours.      . carboxymethylcellulose  (REFRESH PLUS) 0.5 % SOLN Place 1 drop into both eyes 3 (three) times daily as needed. For dry eyes.      . Colesevelam HCl (WELCHOL) 3.75 G PACK Take 3.75 g by mouth daily.      . dorzolamide (TRUSOPT) 2 % ophthalmic solution Place 1 drop into the right eye 2 (two) times daily.      Marland Kitchen HYDROcodone-acetaminophen (VICODIN) 5-500 MG per tablet Take 1 tablet by mouth every 6 (six) hours as needed. For pain.      Marland Kitchen ketoconazole (NIZORAL) 2 % cream Apply 1 application topically 2 (two) times daily.      Marland Kitchen losartan-hydrochlorothiazide (HYZAAR) 100-12.5 MG per tablet Take 1 tablet by mouth daily.      . naproxen (NAPROSYN) 375 MG tablet Take 375 mg by mouth 2 (two) times daily with a meal.      . omega-3 acid ethyl esters (LOVAZA) 1 G capsule Take 2 g by mouth 2 (two) times daily.      . pravastatin (PRAVACHOL) 80 MG tablet Take 80 mg by mouth daily.      . Saxagliptin-Metformin (KOMBIGLYZE XR) 12-998 MG TB24 Take 1 tablet by mouth daily.      . Travoprost, BAK Free, (TRAVATAN) 0.004 % SOLN ophthalmic solution Place 1 drop into the right eye at bedtime.  Review of Systems Review of Systems  Constitutional: Negative for fever, chills and unexpected weight change.  HENT: Negative for hearing loss, congestion, sore throat, trouble swallowing and voice change.   Eyes: Negative for visual disturbance.  Respiratory: Negative for cough and wheezing.   Cardiovascular: Negative for chest pain, palpitations and leg swelling.  Gastrointestinal: Positive for abdominal pain. Negative for nausea, vomiting, diarrhea, constipation, blood in stool, abdominal distention, anal bleeding and rectal pain.  Genitourinary: Negative for hematuria and difficulty urinating.  Musculoskeletal: Positive for arthralgias.  Skin: Negative for rash and wound.  Neurological: Negative for seizures, syncope, weakness and headaches.  Hematological: Negative for adenopathy. Does not bruise/bleed easily.  Psychiatric/Behavioral:  Negative for confusion.    Blood pressure 134/70, pulse 80, temperature 97.6 F (36.4 C), temperature source Temporal, resp. rate 18, height 5\' 2"  (1.575 m), weight 148 lb 9.6 oz (67.405 kg), SpO2 100.00%.  Physical Exam Physical Exam WDWN in NAD HEENT:  EOMI, sclera anicteric Neck:  No masses, no thyromegaly Lungs:  CTA bilaterally; normal respiratory effort CV:  Regular rate and rhythm; no murmurs Abd:  +bowel sounds, soft, non-tender, no masses GU:  Bilateral descended testicles; no sign of right inguinal hernia; left inguinal hernia on Valsalva maneuver;  reducible Ext:  Well-perfused; no edema Skin:  Warm, dry; no sign of jaundice  Data Reviewed none  Assessment    Left inguinal hernia     Plan    Left inguinal hernia repair with mesh.  The surgical procedure has been discussed with the patient.  Potential risks, benefits, alternative treatments, and expected outcomes have been explained.  All of the patient's questions at this time have been answered.  The likelihood of reaching the patient's treatment goal is good.  The patient understand the proposed surgical procedure and wishes to proceed.        Guadalupe Kerekes K. 04/24/2012, 5:16 PM

## 2012-04-26 ENCOUNTER — Encounter (HOSPITAL_COMMUNITY): Payer: Self-pay | Admitting: *Deleted

## 2012-04-28 MED ORDER — CHLORHEXIDINE GLUCONATE 4 % EX LIQD: 1.0000 "application " | Freq: Once | CUTANEOUS | Status: DC

## 2012-04-28 MED ORDER — CEFAZOLIN SODIUM-DEXTROSE 2-3 GM-% IV SOLR
2.0000 g | INTRAVENOUS | Status: AC
  Administered 2012-04-29: 2 g via INTRAVENOUS

## 2012-04-29 ENCOUNTER — Ambulatory Visit (HOSPITAL_COMMUNITY): Payer: Medicare Other | Admitting: Certified Registered"

## 2012-04-29 ENCOUNTER — Ambulatory Visit (HOSPITAL_COMMUNITY): Payer: Medicare Other

## 2012-04-29 ENCOUNTER — Encounter (HOSPITAL_COMMUNITY): Payer: Self-pay | Admitting: *Deleted

## 2012-04-29 ENCOUNTER — Ambulatory Visit (HOSPITAL_COMMUNITY)
Admission: RE | Admit: 2012-04-29 | Discharge: 2012-04-29 | Disposition: A | Discharge status: Home / Self Care | Payer: Medicare Other | Source: Ambulatory Visit | Attending: Surgery | Admitting: Surgery

## 2012-04-29 ENCOUNTER — Encounter (HOSPITAL_COMMUNITY): Payer: Self-pay | Admitting: Certified Registered"

## 2012-04-29 ENCOUNTER — Encounter (HOSPITAL_COMMUNITY)
Admission: RE | Disposition: A | Discharge status: Home / Self Care | Payer: Self-pay | Source: Ambulatory Visit | Attending: Surgery

## 2012-04-29 DIAGNOSIS — I10 Essential (primary) hypertension: Secondary | ICD-10-CM | POA: Insufficient documentation

## 2012-04-29 DIAGNOSIS — K4090 Unilateral inguinal hernia, without obstruction or gangrene, not specified as recurrent: Secondary | ICD-10-CM | POA: Insufficient documentation

## 2012-04-29 DIAGNOSIS — E119 Type 2 diabetes mellitus without complications: Secondary | ICD-10-CM | POA: Insufficient documentation

## 2012-04-29 HISTORY — PX: INGUINAL HERNIA REPAIR: SHX194

## 2012-04-29 LAB — SURGICAL PCR SCREEN: Staphylococcus aureus: NEGATIVE

## 2012-04-29 LAB — BASIC METABOLIC PANEL
CO2: 24 mEq/L (ref 19–32)
Calcium: 9.2 mg/dL (ref 8.4–10.5)
GFR calc non Af Amer: >90 mL/min (ref >90)
Sodium: 140 mEq/L (ref 135–145)

## 2012-04-29 LAB — CBC
Platelets: 160 10*3/uL (ref 150–400)
RBC: 4.46 MIL/uL (ref 4.22–5.81)
WBC: 5.6 10*3/uL (ref 4.0–10.5)

## 2012-04-29 LAB — GLUCOSE, CAPILLARY
Glucose-Capillary: 111 mg/dL — ABNORMAL HIGH (ref 70–99)
Glucose-Capillary: 93 mg/dL (ref 70–99)
Glucose-Capillary: 101 mg/dL — ABNORMAL HIGH (ref 70–99)

## 2012-04-29 SURGERY — REPAIR, HERNIA, INGUINAL, ADULT
Anesthesia: General | Site: Groin | Laterality: Left | Wound class: Clean

## 2012-04-29 MED ORDER — LACTATED RINGERS IV SOLN
INTRAVENOUS | Status: DC
  Administered 2012-04-29: 10:00:00 via INTRAVENOUS

## 2012-04-29 MED ORDER — HYDROMORPHONE HCL PF 1 MG/ML IJ SOLN
INTRAMUSCULAR | Status: AC
  Filled 2012-04-29: qty 1

## 2012-04-29 MED ORDER — MORPHINE SULFATE 2 MG/ML IJ SOLN: 2.0000 mg | INTRAMUSCULAR | Status: DC | PRN

## 2012-04-29 MED ORDER — LIDOCAINE HCL (CARDIAC) 20 MG/ML IV SOLN
INTRAVENOUS | Status: DC | PRN
  Administered 2012-04-29: 80 mg via INTRAVENOUS

## 2012-04-29 MED ORDER — OXYCODONE HCL 5 MG PO TABS: 5.0000 mg | ORAL_TABLET | Freq: Once | ORAL | Status: DC | PRN

## 2012-04-29 MED ORDER — MIDAZOLAM HCL 5 MG/5ML IJ SOLN
INTRAMUSCULAR | Status: DC | PRN
  Administered 2012-04-29: 2 mg via INTRAVENOUS

## 2012-04-29 MED ORDER — LACTATED RINGERS IV SOLN
INTRAVENOUS | Status: DC | PRN
  Administered 2012-04-29: 11:00:00 via INTRAVENOUS

## 2012-04-29 MED ORDER — OXYCODONE-ACETAMINOPHEN 5-325 MG PO TABS: 1.0000 | ORAL_TABLET | ORAL | Status: DC | PRN

## 2012-04-29 MED ORDER — ONDANSETRON HCL 4 MG/2ML IJ SOLN: 4.0000 mg | INTRAMUSCULAR | Status: DC | PRN

## 2012-04-29 MED ORDER — EPHEDRINE SULFATE 50 MG/ML IJ SOLN
INTRAMUSCULAR | Status: DC | PRN
  Administered 2012-04-29: 5 mg via INTRAVENOUS
  Administered 2012-04-29: 20 mg via INTRAVENOUS
  Administered 2012-04-29: 5 mg via INTRAVENOUS

## 2012-04-29 MED ORDER — FENTANYL CITRATE 0.05 MG/ML IJ SOLN
INTRAMUSCULAR | Status: DC | PRN
  Administered 2012-04-29: 25 ug via INTRAVENOUS
  Administered 2012-04-29: 100 ug via INTRAVENOUS

## 2012-04-29 MED ORDER — MUPIROCIN 2 % EX OINT
TOPICAL_OINTMENT | Freq: Two times a day (BID) | CUTANEOUS | Status: DC
  Administered 2012-04-29: 1 via NASAL

## 2012-04-29 MED ORDER — BUPIVACAINE-EPINEPHRINE 0.25% -1:200000 IJ SOLN
INTRAMUSCULAR | Status: DC | PRN
  Administered 2012-04-29: 10 mL

## 2012-04-29 MED ORDER — ONDANSETRON HCL 4 MG/2ML IJ SOLN: 4.0000 mg | Freq: Once | INTRAMUSCULAR | Status: DC | PRN

## 2012-04-29 MED ORDER — OXYCODONE HCL 5 MG/5ML PO SOLN: 5.0000 mg | Freq: Once | ORAL | Status: DC | PRN

## 2012-04-29 MED ORDER — 0.9 % SODIUM CHLORIDE (POUR BTL) OPTIME
TOPICAL | Status: DC | PRN
  Administered 2012-04-29: 1000 mL

## 2012-04-29 MED ORDER — ONDANSETRON HCL 4 MG/2ML IJ SOLN
INTRAMUSCULAR | Status: DC | PRN
  Administered 2012-04-29: 4 mg via INTRAVENOUS

## 2012-04-29 MED ORDER — HYDROMORPHONE HCL PF 1 MG/ML IJ SOLN
0.2500 mg | INTRAMUSCULAR | Status: DC | PRN
  Administered 2012-04-29 (×2): 0.5 mg via INTRAVENOUS

## 2012-04-29 MED ORDER — BUPIVACAINE-EPINEPHRINE PF 0.25-1:200000 % IJ SOLN
INTRAMUSCULAR | Status: AC
  Filled 2012-04-29: qty 30

## 2012-04-29 MED ORDER — OXYCODONE-ACETAMINOPHEN 5-325 MG PO TABS
1.0000 | ORAL_TABLET | ORAL | Status: DC | PRN
Start: 2012-04-29 — End: 2012-04-29

## 2012-04-29 MED ORDER — PROPOFOL 10 MG/ML IV BOLUS
INTRAVENOUS | Status: DC | PRN
  Administered 2012-04-29: 200 mg via INTRAVENOUS

## 2012-04-29 SURGICAL SUPPLY — 47 items
BENZOIN TINCTURE PRP APPL 2/3 (GAUZE/BANDAGES/DRESSINGS) ×2 IMPLANT
BLADE SURG 15 STRL LF DISP TIS (BLADE) ×1 IMPLANT
BLADE SURG 15 STRL SS (BLADE) ×1
BLADE SURG ROTATE 9660 (MISCELLANEOUS) IMPLANT
CHLORAPREP W/TINT 26ML (MISCELLANEOUS) ×2 IMPLANT
CLOTH BEACON ORANGE TIMEOUT ST (SAFETY) ×2 IMPLANT
COVER SURGICAL LIGHT HANDLE (MISCELLANEOUS) ×2 IMPLANT
DECANTER SPIKE VIAL GLASS SM (MISCELLANEOUS) ×2 IMPLANT
DRAIN PENROSE 1/2X12 LTX STRL (WOUND CARE) IMPLANT
DRAPE LAPAROSCOPIC ABDOMINAL (DRAPES) IMPLANT
DRAPE LAPAROTOMY TRNSV 102X78 (DRAPE) IMPLANT
DRAPE UTILITY 15X26 W/TAPE STR (DRAPE) ×4 IMPLANT
DRSG TEGADERM 4X4.75 (GAUZE/BANDAGES/DRESSINGS) ×2 IMPLANT
ELECT CAUTERY BLADE 6.4 (BLADE) ×2 IMPLANT
ELECT REM PT RETURN 9FT ADLT (ELECTROSURGICAL) ×2
ELECTRODE REM PT RTRN 9FT ADLT (ELECTROSURGICAL) ×1 IMPLANT
GAUZE SPONGE 4X4 16PLY XRAY LF (GAUZE/BANDAGES/DRESSINGS) ×2 IMPLANT
GLOVE BIO SURGEON STRL SZ7 (GLOVE) ×2 IMPLANT
GLOVE BIOGEL PI IND STRL 7.5 (GLOVE) ×1 IMPLANT
GLOVE BIOGEL PI INDICATOR 7.5 (GLOVE) ×1
GOWN STRL NON-REIN LRG LVL3 (GOWN DISPOSABLE) ×4 IMPLANT
KIT BASIN OR (CUSTOM PROCEDURE TRAY) ×2 IMPLANT
KIT ROOM TURNOVER OR (KITS) ×2 IMPLANT
MESH ULTRAPRO 3X6 7.6X15CM (Mesh General) ×2 IMPLANT
NEEDLE HYPO 25GX1X1/2 BEV (NEEDLE) ×2 IMPLANT
NS IRRIG 1000ML POUR BTL (IV SOLUTION) ×2 IMPLANT
PACK SURGICAL SETUP 50X90 (CUSTOM PROCEDURE TRAY) ×2 IMPLANT
PAD ARMBOARD 7.5X6 YLW CONV (MISCELLANEOUS) ×2 IMPLANT
PENCIL BUTTON HOLSTER BLD 10FT (ELECTRODE) ×2 IMPLANT
SPECIMEN JAR SMALL (MISCELLANEOUS) IMPLANT
SPONGE GAUZE 4X4 12PLY (GAUZE/BANDAGES/DRESSINGS) ×2 IMPLANT
SPONGE INTESTINAL PEANUT (DISPOSABLE) ×2 IMPLANT
STRIP CLOSURE SKIN 1/2X4 (GAUZE/BANDAGES/DRESSINGS) ×2 IMPLANT
SUT MNCRL AB 4-0 PS2 18 (SUTURE) ×2 IMPLANT
SUT PDS AB 0 CT 36 (SUTURE) IMPLANT
SUT PROLENE 2 0 SH DA (SUTURE) ×4 IMPLANT
SUT SILK 2 0 SH (SUTURE) IMPLANT
SUT SILK 3 0 (SUTURE) ×1
SUT SILK 3-0 18XBRD TIE 12 (SUTURE) ×1 IMPLANT
SUT VIC AB 0 CT2 27 (SUTURE) IMPLANT
SUT VIC AB 2-0 SH 27 (SUTURE) ×1
SUT VIC AB 2-0 SH 27X BRD (SUTURE) ×1 IMPLANT
SUT VIC AB 3-0 SH 27 (SUTURE) ×1
SUT VIC AB 3-0 SH 27XBRD (SUTURE) ×1 IMPLANT
SYR CONTROL 10ML LL (SYRINGE) ×2 IMPLANT
TOWEL OR 17X24 6PK STRL BLUE (TOWEL DISPOSABLE) ×2 IMPLANT
TOWEL OR 17X26 10 PK STRL BLUE (TOWEL DISPOSABLE) ×2 IMPLANT

## 2012-04-29 NOTE — H&P (View-Only) (Signed)
Patient ID: Adrian [name], male   DOB: 03/17/1941, 71 y.o.   MRN: [id number]  Chief Complaint  Patient presents with  . New Evaluation    eval L/H    HPI Adrian [name] is a 71 y.o. male.  Referred by Dr. Thomas Jones for evaluation of left inguinal hernia HPI 71-year-old male presents with a two-month history of pain and swelling in his left groin. The pain is occasionally fairly severe. The pain improves when he is supine. The patient has had a previous right inguinal hernia repair many years ago. Occasionally has some discomfort in this area but it is not as severe as the left side.The patient is undergoing workup for some periumbilical and epigastric abdominal pain. He has had a recent EGD as well as a colonoscopy which were unremarkable by the patient's report. The groin pain seems to be exacerbated when he is upright or lifting any heavy.  Past Medical History  Diagnosis Date  . DIABETES MELLITUS   . HYPERCHOLESTEROLEMIA   . ERECTILE DYSFUNCTION   . Chronic angle-closure glaucoma   . ESSENTIAL HYPERTENSION   . GASTRITIS   . RENAL CYST   . Helicobacter pylori (H. pylori)   . Personal history of colonic polyps     Past Surgical History  Procedure Date  . Hernia repair   . Left rotator cuff repair   . Right wrist surgery   . Eye surgery     (R) glaucoma (to relieve pressure)  . Cataract extraction     Family History  Problem Relation Age of Onset  . Diabetes Mother   . Diabetes Brother   . Hypertension Other     Social History History  Substance Use Topics  . Smoking status: Former Smoker  . Smokeless tobacco: Never Used   Comment: Stopped 30 years ago  . Alcohol Use: No    Allergies  Allergen Reactions  . Lisinopril     REACTION: cough    Current Outpatient Prescriptions  Medication Sig Dispense Refill  . brimonidine-timolol (COMBIGAN) 0.2-0.5 % ophthalmic solution Place 1 drop into the right eye every 12 (twelve) hours.      . carboxymethylcellulose  (REFRESH PLUS) 0.5 % SOLN Place 1 drop into both eyes 3 (three) times daily as needed. For dry eyes.      . Colesevelam HCl (WELCHOL) 3.75 G PACK Take 3.75 g by mouth daily.      . dorzolamide (TRUSOPT) 2 % ophthalmic solution Place 1 drop into the right eye 2 (two) times daily.      . HYDROcodone-acetaminophen (VICODIN) 5-500 MG per tablet Take 1 tablet by mouth every 6 (six) hours as needed. For pain.      . ketoconazole (NIZORAL) 2 % cream Apply 1 application topically 2 (two) times daily.      . losartan-hydrochlorothiazide (HYZAAR) 100-12.5 MG per tablet Take 1 tablet by mouth daily.      . naproxen (NAPROSYN) 375 MG tablet Take 375 mg by mouth 2 (two) times daily with a meal.      . omega-3 acid ethyl esters (LOVAZA) 1 G capsule Take 2 g by mouth 2 (two) times daily.      . pravastatin (PRAVACHOL) 80 MG tablet Take 80 mg by mouth daily.      . Saxagliptin-Metformin (KOMBIGLYZE XR) 12-998 MG TB24 Take 1 tablet by mouth daily.      . Travoprost, BAK Free, (TRAVATAN) 0.004 % SOLN ophthalmic solution Place 1 drop into the right eye at bedtime.          Review of Systems Review of Systems  Constitutional: Negative for fever, chills and unexpected weight change.  HENT: Negative for hearing loss, congestion, sore throat, trouble swallowing and voice change.   Eyes: Negative for visual disturbance.  Respiratory: Negative for cough and wheezing.   Cardiovascular: Negative for chest pain, palpitations and leg swelling.  Gastrointestinal: Positive for abdominal pain. Negative for nausea, vomiting, diarrhea, constipation, blood in stool, abdominal distention, anal bleeding and rectal pain.  Genitourinary: Negative for hematuria and difficulty urinating.  Musculoskeletal: Positive for arthralgias.  Skin: Negative for rash and wound.  Neurological: Negative for seizures, syncope, weakness and headaches.  Hematological: Negative for adenopathy. Does not bruise/bleed easily.  Psychiatric/Behavioral:  Negative for confusion.    Blood pressure 134/70, pulse 80, temperature 97.6 F (36.4 C), temperature source Temporal, resp. rate 18, height 5' 2" (1.575 m), weight 148 lb 9.6 oz (67.405 kg), SpO2 100.00%.  Physical Exam Physical Exam WDWN in NAD HEENT:  EOMI, sclera anicteric Neck:  No masses, no thyromegaly Lungs:  CTA bilaterally; normal respiratory effort CV:  Regular rate and rhythm; no murmurs Abd:  +bowel sounds, soft, non-tender, no masses GU:  Bilateral descended testicles; no sign of right inguinal hernia; left inguinal hernia on Valsalva maneuver;  reducible Ext:  Well-perfused; no edema Skin:  Warm, dry; no sign of jaundice  Data Reviewed none  Assessment    Left inguinal hernia     Plan    Left inguinal hernia repair with mesh.  The surgical procedure has been discussed with the patient.  Potential risks, benefits, alternative treatments, and expected outcomes have been explained.  All of the patient's questions at this time have been answered.  The likelihood of reaching the patient's treatment goal is good.  The patient understand the proposed surgical procedure and wishes to proceed.        Julena Barbour K. 04/24/2012, 5:16 PM    

## 2012-04-29 NOTE — Interval H&P Note (Signed)
History and Physical Interval Note:  04/29/2012 8:44 AM  Adrian [name]  has presented today for surgery, with the diagnosis of Left inguinal hernia  The various methods of treatment have been discussed with the patient and family. After consideration of risks, benefits and other options for treatment, the patient has consented to  Procedure(s) (LRB) with comments: HERNIA REPAIR INGUINAL ADULT (Left) - Left inguinal hernia repair w/mesh INSERTION OF MESH (Left) as a surgical intervention .  The patient's history has been reviewed, patient examined, no change in status, stable for surgery.  I have reviewed the patient's chart and labs.  Questions were answered to the patient's satisfaction.     Elner Seifert K.

## 2012-04-29 NOTE — Anesthesia Postprocedure Evaluation (Signed)
  Anesthesia Post-op Note  Patient: Adrian [name]  Procedure(s) Performed: Procedure(s) (LRB) with comments: HERNIA REPAIR INGUINAL ADULT (Left) - Left inguinal hernia repair w/mesh INSERTION OF MESH (Left) - Insertion of ultrapro mesh  Patient Location: PACU  Anesthesia Type: General  Level of Consciousness: awake, alert  and oriented  Airway and Oxygen Therapy: Patient Spontanous Breathing and Patient connected to nasal cannula oxygen  Post-op Pain: mild  Post-op Assessment: Post-op Vital signs reviewed  Post-op Vital Signs: Reviewed  Complications: No apparent anesthesia complications2

## 2012-04-29 NOTE — Preoperative (Signed)
Beta Blockers   Reason not to administer Beta Blockers:Not Applicable 

## 2012-04-29 NOTE — Transfer of Care (Signed)
Immediate Anesthesia Transfer of Care Note  Patient: Adrian [name]  Procedure(s) Performed: Procedure(s) (LRB) with comments: HERNIA REPAIR INGUINAL ADULT (Left) - Left inguinal hernia repair w/mesh INSERTION OF MESH (Left) - Insertion of ultrapro mesh  Patient Location: PACU  Anesthesia Type: General  Level of Consciousness: lethargic and responds to stimulation  Airway & Oxygen Therapy: Patient Spontanous Breathing and Patient connected to nasal cannula oxygen  Post-op Assessment: Report given to PACU RN, Post -op Vital signs reviewed and stable and Patient moving all extremities  Post vital signs: Reviewed and stable  Complications: No apparent anesthesia complications

## 2012-04-29 NOTE — Anesthesia Procedure Notes (Signed)
Procedure Name: LMA Insertion Date/Time: 04/29/2012 10:57 AM Performed by: Jerilee Hoh Pre-anesthesia Checklist: Patient identified, Emergency Drugs available, Suction available and Patient being monitored Patient Re-evaluated:Patient Re-evaluated prior to inductionOxygen Delivery Method: Circle system utilized Preoxygenation: Pre-oxygenation with 100% oxygen Intubation Type: IV induction LMA: LMA inserted LMA Size: 4.0 Tube type: Oral Number of attempts: 1 Placement Confirmation: positive ETCO2 and breath sounds checked- equal and bilateral Tube secured with: Tape Dental Injury: Teeth and Oropharynx as per pre-operative assessment

## 2012-04-29 NOTE — Op Note (Signed)
Hernia, Open, Procedure Note  Indications: The patient presented with a history of a left, reducible inguinal hernia.    Pre-operative Diagnosis: left reducible inguinal hernia Post-operative Diagnosis: same  Surgeon: Wynona Luna.   Assistants: none  Anesthesia: General LMA anesthesia  ASA Class: 2  Procedure Details  The patient was seen again in the Holding Room. The risks, benefits, complications, treatment options, and expected outcomes were discussed with the patient. The possibilities of reaction to medication, pulmonary aspiration, perforation of viscus, bleeding, recurrent infection, the need for additional procedures, and development of a complication requiring transfusion or further operation were discussed with the patient and/or family. The likelihood of success in repairing the hernia and returning the patient to their previous functional status is good.  There was concurrence with the proposed plan, and informed consent was obtained. The site of surgery was properly noted/marked. The patient was taken to the Operating Room, identified as Adrian [name], and the procedure verified as left inguinal hernia repair. A Time Out was held and the above information confirmed.  The patient was placed in the supine position and underwent induction of anesthesia. The lower abdomen and groin was prepped with Chloraprep and draped in the standard fashion, and 0.25% Marcaine with epinephrine was used to anesthetize the skin over the mid-portion of the inguinal canal. An oblique incision was made. Dissection was carried down through the subcutaneous tissue with cautery to the external oblique fascia.  We opened the external oblique fascia along the direction of its fibers to the external ring.  The spermatic cord was circumferentially dissected bluntly and retracted with a Penrose drain.  The floor of the inguinal canal was inspected and was intact.  We skeletonized the spermatic cord and  reduced a moderate-sized indirect hernia and cord lipoma.  We used a 3 x 6 inch piece of Ultrapro mesh, which was cut into a keyhole shape.  This was secured with 2-0 Prolene, beginning at the pubic tubercle, running this along the internal oblique fascia superiorly and the shelving edge inferiorly.  The tails of the mesh were sutured together behind the spermatic cord.  The mesh was tucked underneath the external oblique fascia laterally.  The external oblique fascia was reapproximated with 2-0 Vicryl.  3-0 Vicryl was used to close the subcutaneous tissues and 4-0 Monocryl was used to close the skin in subcuticular fashion.  Benzoin and steri-strips were used to seal the incision.  A clean dressing was applied.  The patient was then extubated and brought to the recovery room in stable condition.  All sponge, instrument, and needle counts were correct prior to closure and at the conclusion of the case.   Estimated Blood Loss: Minimal                 Complications: None; patient tolerated the procedure well.         Disposition: PACU - hemodynamically stable.         Condition: stable  Adrian [name]. Adrian Skains, MD, [facility]  04/29/2012 12:29 PM

## 2012-04-29 NOTE — Anesthesia Preprocedure Evaluation (Addendum)
Anesthesia Evaluation  Patient identified by MRN, date of birth, ID band Patient awake    Airway Mallampati: II TM Distance: >3 FB Neck ROM: Full    Dental  (+) Teeth Intact and Dental Advisory Given   Pulmonary  breath sounds clear to auscultation        Cardiovascular hypertension, Pt. on medications Rhythm:Regular Rate:Normal     Neuro/Psych    GI/Hepatic   Endo/Other  diabetes, Well Controlled, Type 2, Oral Hypoglycemic Agents  Renal/GU      Musculoskeletal   Abdominal   Peds  Hematology   Anesthesia Other Findings   Reproductive/Obstetrics                          Anesthesia Physical Anesthesia Plan  ASA: III  Anesthesia Plan: General   Post-op Pain Management:    Induction: Intravenous  Airway Management Planned: LMA  Additional Equipment:   Intra-op Plan:   Post-operative Plan: Extubation in OR  Informed Consent: I have reviewed the patients History and Physical, chart, labs and discussed the procedure including the risks, benefits and alternatives for the proposed anesthesia with the patient or authorized representative who has indicated his/her understanding and acceptance.   Dental advisory given  Plan Discussed with: Anesthesiologist, Surgeon and CRNA  Anesthesia Plan Comments:        Anesthesia Quick Evaluation

## 2012-04-30 NOTE — Progress Notes (Addendum)
Patient concerned about constipation. Went over with patient on taking colace today and if No BM to use MOM tomorrow. If patient start to have pain or no BM after MOM to call physician. Teach back method used. Call completed by Molly Maduro B. Tawanna Cooler, RN

## 2012-05-01 ENCOUNTER — Encounter (HOSPITAL_COMMUNITY): Payer: Self-pay | Admitting: Surgery

## 2012-05-02 ENCOUNTER — Telehealth (INDEPENDENT_AMBULATORY_CARE_PROVIDER_SITE_OTHER): Payer: Self-pay | Admitting: General Surgery

## 2012-05-02 NOTE — Telephone Encounter (Signed)
Pt's fiancee called to report swelling of penis and scrotum following inguinal hernia surgery.  Explained, while it looks bad, it is quite normal following this type of surgery.  Advised to try to elevate scrotum while sitting to allow reabsorption of the extra fluid.  May take quite a while for it all to clear up.  Wear loose-fitting clothing.  She understands.

## 2012-05-06 ENCOUNTER — Other Ambulatory Visit: Payer: Self-pay

## 2012-05-06 ENCOUNTER — Telehealth (INDEPENDENT_AMBULATORY_CARE_PROVIDER_SITE_OTHER): Payer: Self-pay | Admitting: General Surgery

## 2012-05-06 MED ORDER — LOSARTAN POTASSIUM-HCTZ 100-12.5 MG PO TABS: 1.0000 | ORAL_TABLET | Freq: Every day | ORAL | Status: DC

## 2012-05-06 NOTE — Telephone Encounter (Signed)
Patient called in saying he was still swollen from surgery. Checked chart and he had hernia repair with mesh last week on 04/29/12. Patient stated that he does not have a fever, no sign of infection and that he has been able to use the bathroom with no problem. Advised patient it will take at least two weeks for the swelling to go down. Advised patient to keep appointment he has to see Dr. Corliss Skains post op on 05/13/12. Patient agreed.

## 2012-05-09 ENCOUNTER — Telehealth (INDEPENDENT_AMBULATORY_CARE_PROVIDER_SITE_OTHER): Payer: Self-pay | Admitting: General Surgery

## 2012-05-09 NOTE — Telephone Encounter (Signed)
Request called for pain medication refill.This was first request after surgery. Standard Post-Op pain medication/ Hydrocodone 5/325 #30 called to [facility]/ [address] [phone]. Caller notified of call in/gy

## 2012-05-13 ENCOUNTER — Ambulatory Visit (INDEPENDENT_AMBULATORY_CARE_PROVIDER_SITE_OTHER): Payer: Medicare Other | Admitting: Surgery

## 2012-05-13 ENCOUNTER — Encounter (INDEPENDENT_AMBULATORY_CARE_PROVIDER_SITE_OTHER): Payer: Self-pay | Admitting: Surgery

## 2012-05-13 VITALS — BP 130/82 | HR 65 | Temp 98.1°F | Resp 18 | Ht 62.0 in | Wt 145.0 lb

## 2012-05-13 DIAGNOSIS — K4090 Unilateral inguinal hernia, without obstruction or gangrene, not specified as recurrent: Secondary | ICD-10-CM

## 2012-05-13 NOTE — Progress Notes (Signed)
Status post left inguinal hernia repair with mesh on 04/29/12.  He had an indirect hernia. He still has a lot of soreness. The swelling is improving. No sign of recurrent hernia. His incision is healing well with no sign of infection. He should continue limiting his level of activity for the next few weeks. Followup as needed.  Wilmon Arms. Corliss Skains, MD, [facility]  05/13/2012 2:59 PM

## 2012-06-04 ENCOUNTER — Other Ambulatory Visit: Payer: Self-pay

## 2012-06-04 MED ORDER — SAXAGLIPTIN-METFORMIN ER 5-1000 MG PO TB24: 1.0000 | ORAL_TABLET | Freq: Every day | ORAL | Status: DC

## 2012-06-05 ENCOUNTER — Ambulatory Visit (INDEPENDENT_AMBULATORY_CARE_PROVIDER_SITE_OTHER): Payer: Medicare Other | Admitting: *Deleted

## 2012-06-05 DIAGNOSIS — Z23 Encounter for immunization: Secondary | ICD-10-CM

## 2012-08-26 ENCOUNTER — Other Ambulatory Visit: Payer: Self-pay | Admitting: Internal Medicine

## 2012-08-28 ENCOUNTER — Encounter: Payer: Self-pay | Admitting: Internal Medicine

## 2012-08-28 ENCOUNTER — Ambulatory Visit (INDEPENDENT_AMBULATORY_CARE_PROVIDER_SITE_OTHER): Payer: Medicare Other | Admitting: Internal Medicine

## 2012-08-28 ENCOUNTER — Other Ambulatory Visit (INDEPENDENT_AMBULATORY_CARE_PROVIDER_SITE_OTHER): Payer: Medicare Other

## 2012-08-28 VITALS — BP 128/60 | HR 64 | Temp 98.8°F | Resp 16 | Wt 150.5 lb

## 2012-08-28 DIAGNOSIS — E119 Type 2 diabetes mellitus without complications: Secondary | ICD-10-CM

## 2012-08-28 DIAGNOSIS — L259 Unspecified contact dermatitis, unspecified cause: Secondary | ICD-10-CM

## 2012-08-28 DIAGNOSIS — L309 Dermatitis, unspecified: Secondary | ICD-10-CM

## 2012-08-28 DIAGNOSIS — I10 Essential (primary) hypertension: Secondary | ICD-10-CM

## 2012-08-28 LAB — COMPREHENSIVE METABOLIC PANEL
Albumin: 3.9 g/dL (ref 3.5–5.2)
Alkaline Phosphatase: 47 U/L (ref 39–117)
BUN: 21 mg/dL (ref 6–23)
GFR: 106.69 mL/min (ref >60.00)
Glucose, Bld: 161 mg/dL — ABNORMAL HIGH (ref 70–99)
Total Bilirubin: 1.8 mg/dL — ABNORMAL HIGH (ref 0.3–1.2)

## 2012-08-28 MED ORDER — FLUOCINONIDE-E 0.05 % EX CREA: TOPICAL_CREAM | Freq: Two times a day (BID) | CUTANEOUS | Status: DC

## 2012-08-28 MED ORDER — METHYLPREDNISOLONE ACETATE 80 MG/ML IJ SUSP
120.0000 mg | Freq: Once | INTRAMUSCULAR | Status: AC
  Administered 2012-08-28: 120 mg via INTRAMUSCULAR

## 2012-08-28 NOTE — Assessment & Plan Note (Signed)
He was given an injection of depo-medrol IM He will try lidex cream to AA He was given pt ed material

## 2012-08-28 NOTE — Progress Notes (Signed)
Subjective:    Patient ID: Adrian [name], male    DOB: 02-Sep-1940, 72 y.o.   MRN: [id number]  Rash This is a recurrent problem. The current episode started more than 1 month ago. The problem is unchanged. The affected locations include the left hand, right hand and left upper leg. The rash is characterized by itchiness and dryness. Pertinent negatives include no anorexia, congestion, cough, diarrhea, eye pain, facial edema, fatigue, fever, joint pain, nail changes, rhinorrhea, shortness of breath, sore throat or vomiting. Past treatments include antihistamine and anti-itch cream. The treatment provided mild relief. His past medical history is significant for eczema.      Review of Systems  Constitutional: Negative for fever, chills, diaphoresis, activity change, appetite change, fatigue and unexpected weight change.  HENT: Negative.  Negative for congestion, sore throat and rhinorrhea.   Eyes: Negative.  Negative for pain.  Respiratory: Negative for cough, chest tightness, shortness of breath, wheezing and stridor.   Cardiovascular: Negative for chest pain, palpitations and leg swelling.  Gastrointestinal: Negative for nausea, vomiting, abdominal pain, diarrhea and anorexia.  Genitourinary: Negative.   Musculoskeletal: Negative.  Negative for myalgias, back pain, joint pain, joint swelling, arthralgias and gait problem.  Skin: Positive for rash. Negative for nail changes, color change, pallor and wound.  Neurological: Negative.  Negative for dizziness, weakness and light-headedness.  Hematological: Negative for adenopathy. Does not bruise/bleed easily.  Psychiatric/Behavioral: Negative.        Objective:   Physical Exam  Vitals reviewed. Constitutional: He is oriented to person, place, and time. He appears well-developed and well-nourished. No distress.  HENT:  Head: Normocephalic and atraumatic.  Mouth/Throat: Oropharynx is clear and moist. No oropharyngeal exudate.  Eyes:  Conjunctivae normal are normal. Right eye exhibits no discharge. Left eye exhibits no discharge. No scleral icterus.  Neck: Normal range of motion. Neck supple. No JVD present. No tracheal deviation present. No thyromegaly present.  Cardiovascular: Normal rate, regular rhythm, normal heart sounds and intact distal pulses.  Exam reveals no gallop and no friction rub.   No murmur heard. Pulmonary/Chest: Effort normal and breath sounds normal. No stridor. No respiratory distress. He has no wheezes. He has no rales. He exhibits no tenderness.  Abdominal: Soft. Bowel sounds are normal. He exhibits no distension and no mass. There is no tenderness. There is no rebound and no guarding.  Musculoskeletal: Normal range of motion. He exhibits no edema and no tenderness.  Lymphadenopathy:    He has no cervical adenopathy.  Neurological: He is oriented to person, place, and time.  Skin: Skin is warm, dry and intact. Rash noted. No abrasion, no bruising, no burn, no ecchymosis, no lesion, no petechiae and no purpura noted. Rash is macular and papular. Rash is not maculopapular, not nodular, not pustular, not vesicular and not urticarial. He is not diaphoretic. No erythema. No pallor.     Psychiatric: He has a normal mood and affect. His behavior is normal. Judgment and thought content normal.     Lab Results  Component Value Date   WBC 5.6 04/29/2012   HGB 13.8 04/29/2012   HCT 40.6 04/29/2012   PLT 160 04/29/2012   GLUCOSE 118* 04/29/2012   CHOL 160 01/30/2012   TRIG 84.0 01/30/2012   HDL 67.80 01/30/2012   LDLDIRECT 116.6 11/10/2010   LDLCALC 75 01/30/2012   ALT 24 01/30/2012   AST 21 01/30/2012   NA 140 04/29/2012   K 3.8 04/29/2012   CL 107 04/29/2012   CREATININE 0.71 04/29/2012  BUN 24* 04/29/2012   CO2 24 04/29/2012   TSH 2.52 01/30/2012   PSA 1.68 05/18/2010   HGBA1C 6.0 01/30/2012   MICROALBUR 0.9 09/14/2009      Assessment & Plan:

## 2012-08-28 NOTE — Addendum Note (Signed)
Addended by: Rock Nephew T on: 08/28/2012 03:17 PM   Modules accepted: Orders

## 2012-08-28 NOTE — Patient Instructions (Signed)

## 2012-08-28 NOTE — Assessment & Plan Note (Signed)
I will check his a1c and will monitor his renal function 

## 2012-08-28 NOTE — Assessment & Plan Note (Signed)
His BP is well controlled Today I will check his lytes and renal function 

## 2012-08-31 IMAGING — CT CT ABDOMEN WO/W CM
3 of 9 series · 13 of 46 positions shown, 19 images · IV contrast (Omnipaque 300)
Comparison: Ultrasound of 11/24/2009.  Abdominal pelvic CT of
05/11/2009.

CLINICAL DATA: Abdominal pain.  Chronic.  Pain for over 4 years.
Elevated lipase.  Evaluate liver.  History of diabetes.
Hypertension.  Gastritis.  Colonic polyps.

CT ABDOMEN WITHOUT AND WITH CONTRAST
TECHNIQUE: Multidetector CT imaging of the abdomen was performed
following the standard protocol before and during bolus
administration of intravenous contrast.
Contrast: 100mL OMNIPAQUE IOHEXOL 300 MG/ML  SOLN

[Series 5: liver arterial · axial · arterial · 0.67mm/px · z∈[-258,-192]mm · 3 of 90 slices shown]
[im 12/90  soft-tissue]
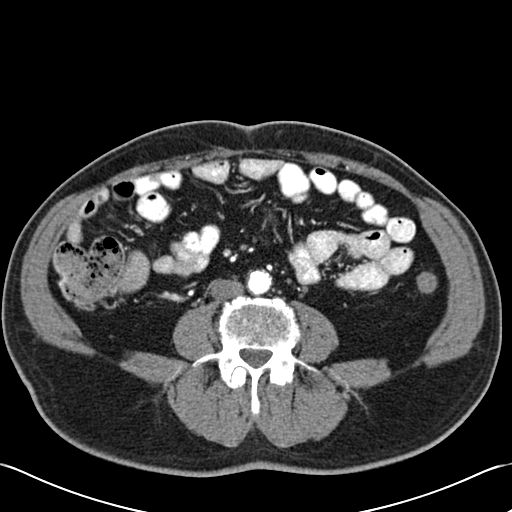
[im 23/90  soft-tissue]
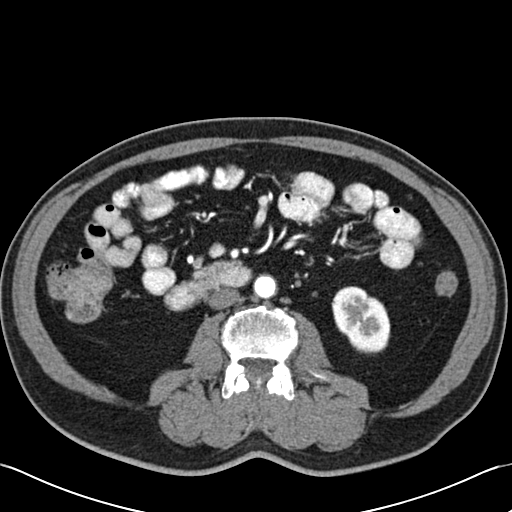
[im 34/90  soft-tissue]
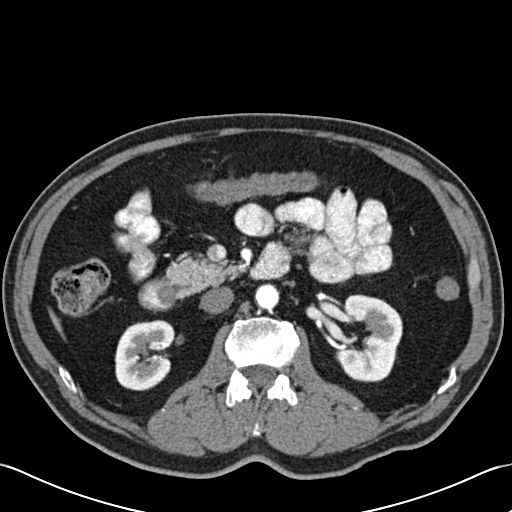

[Series 6: liver portal-venous · axial · portal-venous · 0.67mm/px · z∈[-255,-57]mm · 7 of 89 slices shown, 12 images]
[im 12/89  soft-tissue]
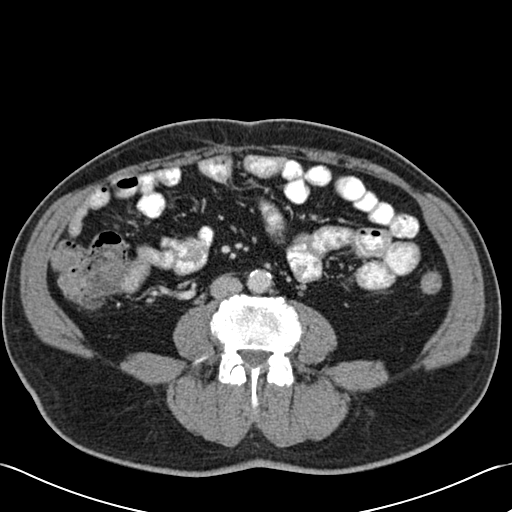
[im 12/89  bone]
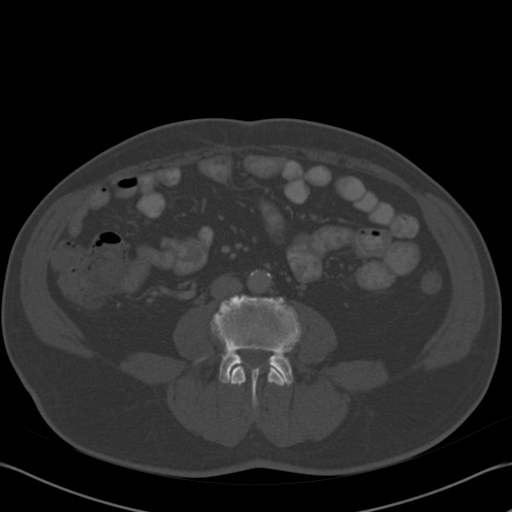
[im 23/89  soft-tissue]
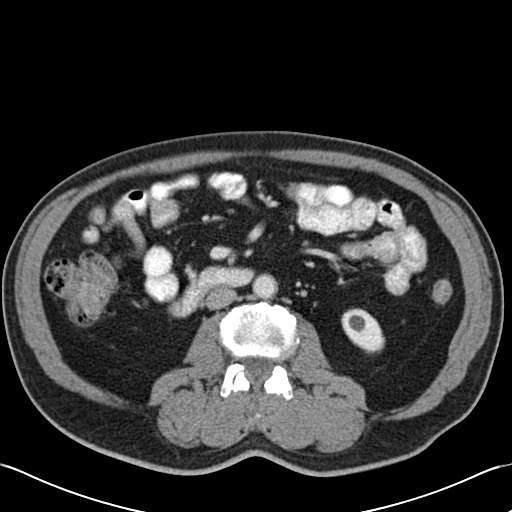
[im 34/89  soft-tissue]
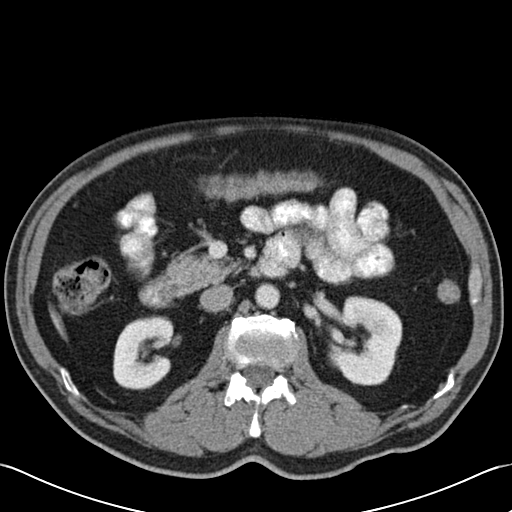
[im 45/89  soft-tissue]
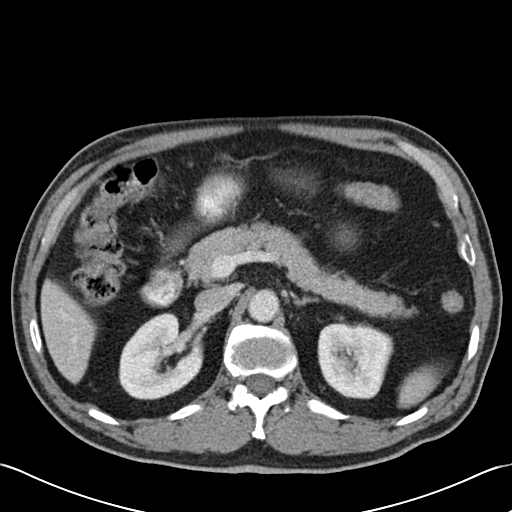
[im 45/89  lung]
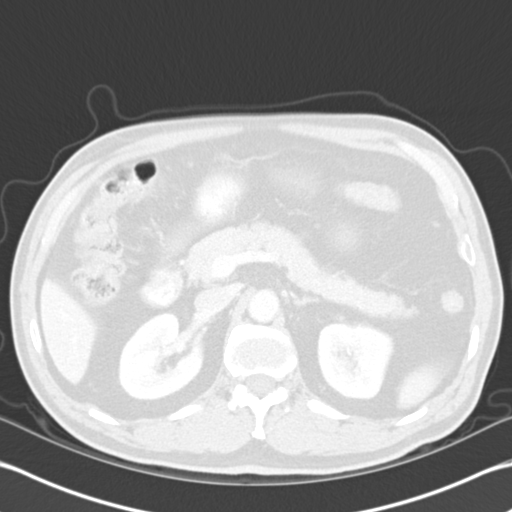
[im 56/89  soft-tissue]
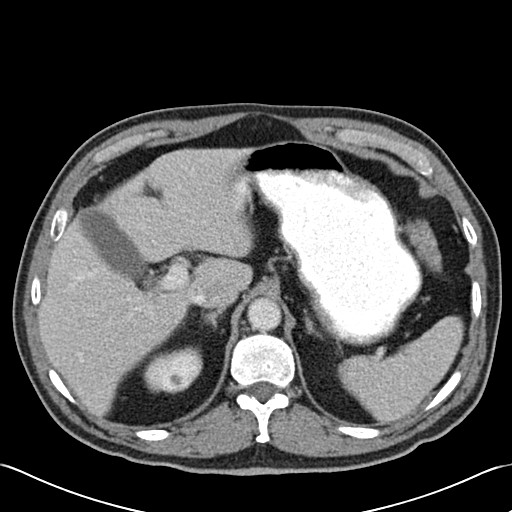
[im 56/89  lung]
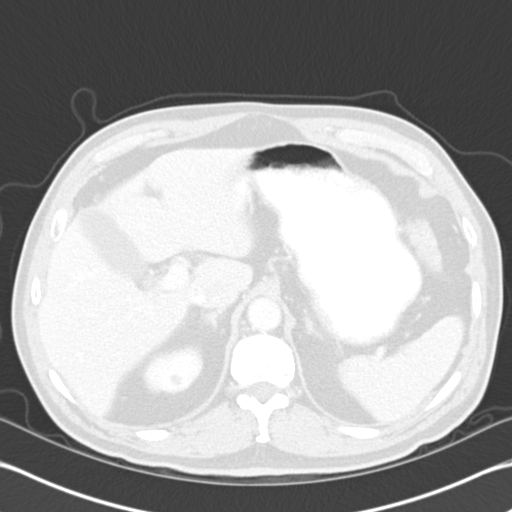
[im 67/89  soft-tissue]
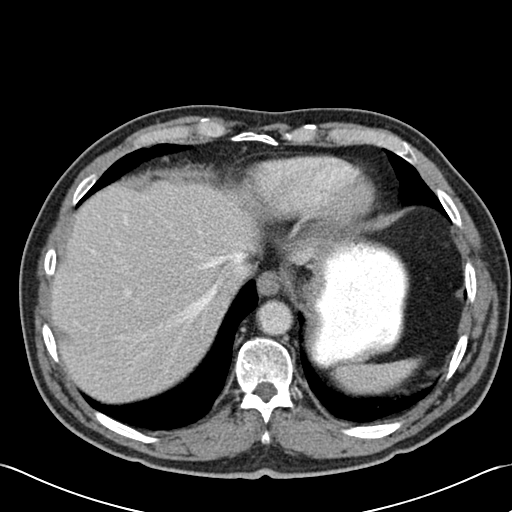
[im 67/89  lung]
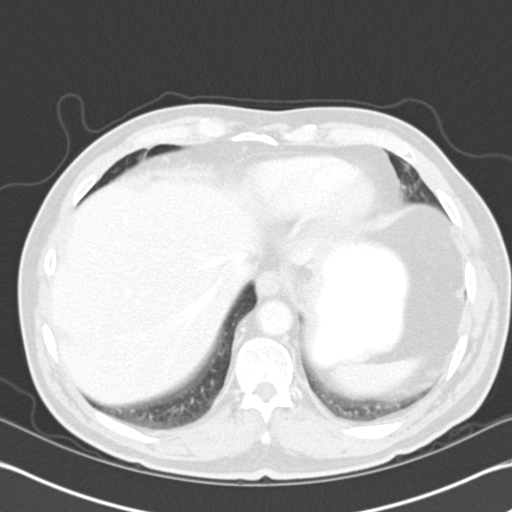
[im 78/89  soft-tissue]
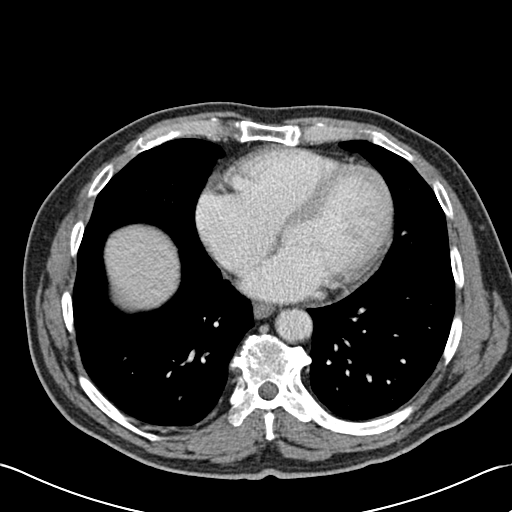
[im 78/89  lung]
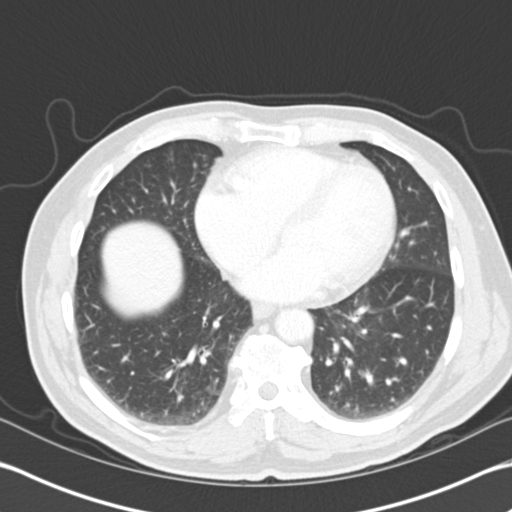

[Series 602: cor · coronal · 0.67mm/px · 3 of 111 slices shown, 4 images]
[im 28/111  soft-tissue]
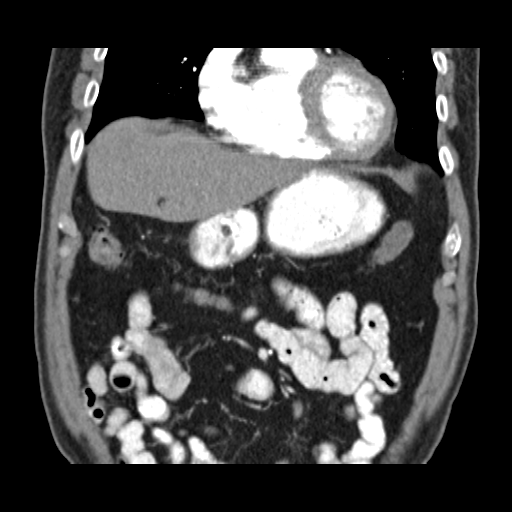
[im 56/111  soft-tissue]
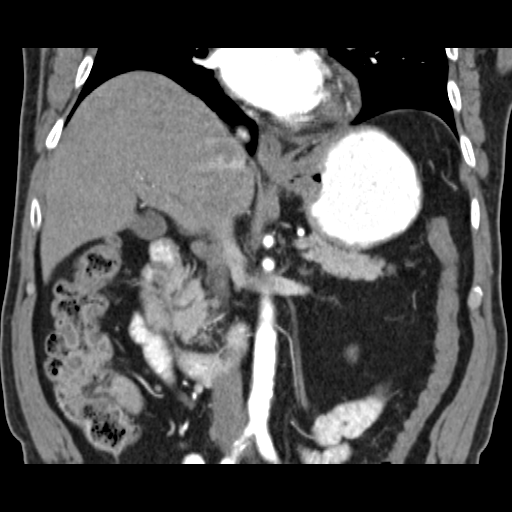
[im 56/111  bone]
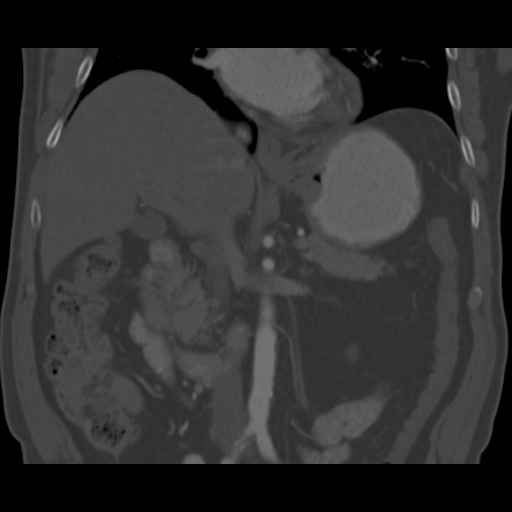
[im 83/111  soft-tissue]
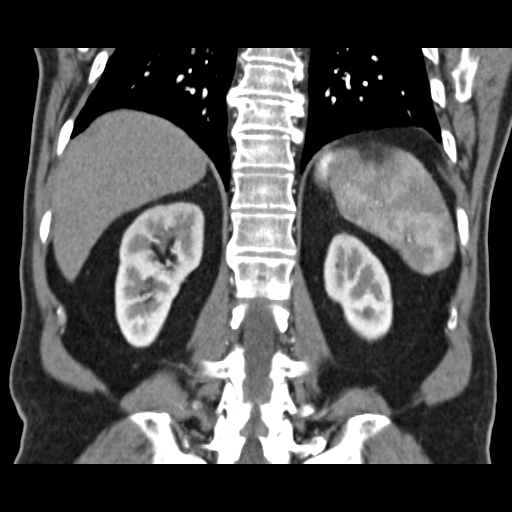

[13 of 46 positions shown; findings below may reference images not displayed]

FINDINGS: Unenhanced images demonstrate no abnormal calcifications
within the liver. No renal calculi or hydronephrosis.  No
pancreatic calcifications.

Post contrast images demonstrate no hypervascular lesions within
the liver, pancreas on arterial phase imaging.

Portal venous phase images demonstrate clear lung bases.  Heart
size upper normal.  LAD coronary artery atherosclerosis. No
pericardial or pleural effusion.

Normal appearance of the liver.  Patent portal and hepatic veins.
Mild motion degradation within the mid abdomen on portal venous
phase images.

Normal spleen, stomach, pancreas, gallbladder, biliary tract,
adrenal glands.

Too small to characterize upper pole right renal lesion measures 7
mm and is similar to back in 8171.  A lower pole left renal cyst
measures 9 mm.  Other bilateral too small to characterize renal
lesions.

Aortic and branch vessel atherosclerosis. No retroperitoneal or
retrocrural adenopathy.

The transverse and descending colon are underdistended.  The
ascending colon, terminal ileum, and appendix are all within normal
limits. Normal abdominal small bowel without ascites. No acute
osseous abnormality.
IMPRESSION: 1.  Normal appearance of the liver.
2.  No explanation for chronic abdominal pain or elevated lipase.
3.  LAD coronary artery atherosclerosis.

## 2012-10-02 ENCOUNTER — Ambulatory Visit (INDEPENDENT_AMBULATORY_CARE_PROVIDER_SITE_OTHER): Payer: Medicare Other | Admitting: Internal Medicine

## 2012-10-02 ENCOUNTER — Encounter: Payer: Self-pay | Admitting: Internal Medicine

## 2012-10-02 VITALS — BP 110/66 | HR 53 | Temp 97.1°F | Resp 16 | Wt 147.3 lb

## 2012-10-02 MED ORDER — FREESTYLE LANCETS MISC: 1.0000 | Freq: Two times a day (BID) | Status: DC

## 2012-10-02 MED ORDER — GLUCOSE BLOOD VI STRP: ORAL_STRIP | Status: DC

## 2012-10-02 MED ORDER — METHYLPREDNISOLONE ACETATE 40 MG/ML IJ SUSP
40.0000 mg | Freq: Once | INTRAMUSCULAR | Status: AC
  Administered 2012-10-02: 40 mg via INTRAMUSCULAR

## 2012-10-02 MED ORDER — HYDROXYZINE PAMOATE 25 MG PO CAPS: 25.0000 mg | ORAL_CAPSULE | Freq: Three times a day (TID) | ORAL | Status: DC | PRN

## 2012-10-02 MED ORDER — METHYLPREDNISOLONE ACETATE 80 MG/ML IJ SUSP
80.0000 mg | Freq: Once | INTRAMUSCULAR | Status: AC
  Administered 2012-10-02: 80 mg via INTRAMUSCULAR

## 2012-10-02 MED ORDER — FREESTYLE SYSTEM KIT: 1.0000 | PACK | Freq: Two times a day (BID) | Status: DC

## 2012-10-02 NOTE — Patient Instructions (Signed)

## 2012-10-02 NOTE — Progress Notes (Signed)
Subjective:    Patient ID: Adrian [name], male    DOB: 08/30/1940, 72 y.o.   MRN: [id number]  Rash This is a recurrent problem. The current episode started 1 to 4 weeks ago. The problem has been gradually worsening since onset. The affected locations include the left upper leg, torso, abdomen, right arm and left arm. The rash is characterized by dryness and itchiness. He was exposed to nothing. Pertinent negatives include no anorexia, congestion, cough, diarrhea, eye pain, facial edema, fatigue, fever, joint pain, nail changes, rhinorrhea, shortness of breath, sore throat or vomiting. Past treatments include nothing. The treatment provided no relief. His past medical history is significant for allergies and eczema.      Review of Systems  Constitutional: Negative.  Negative for fever and fatigue.  HENT: Negative.  Negative for congestion, sore throat and rhinorrhea.   Eyes: Negative.  Negative for pain.  Respiratory: Negative.  Negative for cough, choking, shortness of breath, wheezing and stridor.   Cardiovascular: Negative.   Gastrointestinal: Negative.  Negative for vomiting, diarrhea and anorexia.  Endocrine: Negative.   Genitourinary: Negative.   Musculoskeletal: Negative.  Negative for joint pain.  Skin: Positive for rash. Negative for nail changes, color change, pallor and wound.  Allergic/Immunologic: Negative.   Neurological: Negative.   Hematological: Negative for adenopathy. Does not bruise/bleed easily.  Psychiatric/Behavioral: Negative.        Objective:   Physical Exam  Vitals reviewed. Constitutional: He is oriented to person, place, and time. He appears well-developed and well-nourished.  Non-toxic appearance. He does not have a sickly appearance. He does not appear ill. No distress.  HENT:  Head: Normocephalic and atraumatic.  Mouth/Throat: Oropharynx is clear and moist. No oropharyngeal exudate.  Eyes: Conjunctivae are normal. Right eye exhibits no discharge.  Left eye exhibits no discharge. No scleral icterus.  Neck: Normal range of motion. Neck supple. No JVD present. No tracheal deviation present. No thyromegaly present.  Cardiovascular: Normal rate, regular rhythm, normal heart sounds and intact distal pulses.  Exam reveals no gallop and no friction rub.   No murmur heard. Pulmonary/Chest: Effort normal and breath sounds normal. No stridor. No respiratory distress. He has no wheezes. He has no rales. He exhibits no tenderness.  Abdominal: Soft. Bowel sounds are normal. He exhibits no distension and no mass. There is no tenderness. There is no rebound and no guarding.  Musculoskeletal: Normal range of motion. He exhibits no edema and no tenderness.  Lymphadenopathy:    He has no cervical adenopathy.  Neurological: He is oriented to person, place, and time.  Skin: Skin is warm, dry and intact. Rash noted. No abrasion, no bruising, no burn, no ecchymosis, no laceration, no lesion, no petechiae and no purpura noted. Rash is papular (in groups with coalescence and xerosis/scale). Rash is not macular, not maculopapular, not nodular, not pustular, not vesicular and not urticarial. He is not diaphoretic. No cyanosis or erythema. No pallor. Nails show no clubbing.     Psychiatric: He has a normal mood and affect. His behavior is normal. Thought content normal.      Lab Results  Component Value Date   WBC 5.6 04/29/2012   HGB 13.8 04/29/2012   HCT 40.6 04/29/2012   PLT 160 04/29/2012   GLUCOSE 161* 08/28/2012   CHOL 160 01/30/2012   TRIG 84.0 01/30/2012   HDL 67.80 01/30/2012   LDLDIRECT 116.6 11/10/2010   LDLCALC 75 01/30/2012   ALT 31 08/28/2012   AST 24 08/28/2012   NA  139 08/28/2012   K 3.4* 08/28/2012   CL 103 08/28/2012   CREATININE 0.8 08/28/2012   BUN 21 08/28/2012   CO2 28 08/28/2012   TSH 2.52 01/30/2012   PSA 1.68 05/18/2010   HGBA1C 6.1 08/28/2012   MICROALBUR 0.9 09/14/2009      Assessment & Plan:

## 2012-10-03 NOTE — Assessment & Plan Note (Signed)
I asked him to start using the lidex for this He will try vistaril for itching He has a flare today so I gave him an injection of depo-medrol

## 2012-10-09 ENCOUNTER — Ambulatory Visit: Payer: Medicare Other | Admitting: Internal Medicine

## 2012-10-17 ENCOUNTER — Ambulatory Visit (INDEPENDENT_AMBULATORY_CARE_PROVIDER_SITE_OTHER): Payer: Medicare Other | Admitting: Internal Medicine

## 2012-10-17 ENCOUNTER — Encounter: Payer: Self-pay | Admitting: Internal Medicine

## 2012-10-17 VITALS — BP 142/78 | HR 59 | Temp 97.2°F | Ht 62.0 in | Wt 146.0 lb

## 2012-10-17 MED ORDER — LEVOCETIRIZINE DIHYDROCHLORIDE 5 MG PO TABS
5.0000 mg | ORAL_TABLET | Freq: Every evening | ORAL | Status: DC
Start: 2012-10-17 — End: 2013-08-28

## 2012-10-17 NOTE — Progress Notes (Signed)
HPI  Pt presents to the clinic today with c/o cold symptoms x 1 day. He is experiencing a runny nose, watery eyes and sore throat. He has taken OTC nighttime cough and cold medicine. It has not helped. He does have a history of allergies but has never taken an antihistamine for it. He denies nausea, fever, chills or body aches. He has not had sick contacts.  Review of Systems      Past Medical History  Diagnosis Date  . DIABETES MELLITUS   . HYPERCHOLESTEROLEMIA   . ERECTILE DYSFUNCTION   . 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 ESSENTIAL HYPERTENSION   . GASTRITIS   . RENAL CYST   . Helicobacter pylori (H. pylori)   . Personal history of colonic polyps     Family History  Problem Relation Age of Onset  . Diabetes Mother   . Diabetes Brother   . Hypertension Other   . Hearing loss Daughter     History   Social History  . Marital Status: Divorced    Spouse Name: N/A    Number of Children: 7  . Years of Education: N/A   Occupational History  . Retired    Social History Main Topics  . Smoking status: Former Games developer  . Smokeless tobacco: Never Used     Comment: Stopped 30 years ago  . Alcohol Use: No  . Drug Use: No  . Sexually Active: Yes   Other Topics Concern  . Not on file   Social History Narrative  . No narrative on file    Allergies  Allergen Reactions  . Lisinopril     REACTION: cough     Constitutional: . Denies headache, fatigue, fever abrupt weight changes.  HEENT:  Positive runny nose and sore throat. Denies eye redness, eye pain, pressure behind the eyes, facial pain, nasal congestion, ear pain, ringing in the ears, wax buildup, or bloody nose. Respiratory:  Denies cough,  difficulty breathing or shortness of breath.  Cardiovascular: Denies chest pain, chest tightness, palpitations or swelling in the hands or feet.   No other specific complaints in a complete review of systems (except as listed in HPI above).  Objective:   BP 142/78   Pulse 59  Temp(Src) 97.2 F (36.2 C) (Oral)  Ht 5\' 2"  (1.575 m)  Wt 146 lb (66.225 kg)  BMI 26.7 kg/m2  SpO2 96% Wt Readings from Last 3 Encounters:  10/17/12 146 lb (66.225 kg)  10/02/12 147 lb 5 oz (66.821 kg)  08/28/12 150 lb 8 oz (68.266 kg)     General: Appears his stated age, well developed, well nourished in NAD. HEENT: Head: normal shape and size; Eyes: sclera white, no icterus, conjunctiva pink, PERRLA and EOMs intact; Ears: Tm's gray and intact, normal light reflex; Nose: mucosa boggy and moist, septum midline; Throat/Mouth: + PND. Teeth present, mucosa erythematous and moist, no exudate noted, no lesions or ulcerations noted.  Neck:  Neck supple, trachea midline. No massses, lumps or thyromegaly present.  Cardiovascular: Normal rate and rhythm. S1,S2 noted.  No murmur, rubs or gallops noted. No JVD or BLE edema. No carotid bruits noted. Pulmonary/Chest: Normal effort and positive vesicular breath sounds. No respiratory distress. No wheezes, rales or ronchi noted.      Assessment & Plan:   Allergic Rhinitis:  eRx for Xyzal 5 mg daily Pt declined RX for Flonase at this time Avoid triggers if you know what they are  RTC as needed or if symptoms persist.

## 2012-10-17 NOTE — Patient Instructions (Signed)
Allergic Rhinitis  Allergic rhinitis is when the mucous membranes in the nose respond to allergens. Allergens are particles in the air that cause your body to have an allergic reaction. This causes you to release allergic antibodies. Through a chain of events, these eventually cause you to release histamine into the blood stream (hence the use of antihistamines). Although meant to be protective to the body, it is this release that causes your discomfort, such as frequent sneezing, congestion and an itchy runny nose.    CAUSES    The pollen allergens may come from grasses, trees, and weeds. This is seasonal allergic rhinitis, or "hay fever." Other allergens cause year-round allergic rhinitis (perennial allergic rhinitis) such as house dust mite allergen, pet dander and mold spores.    SYMPTOMS     Nasal stuffiness (congestion).   Runny, itchy nose with sneezing and tearing of the eyes.   There is often an itching of the mouth, eyes and ears.  It cannot be cured, but it can be controlled with medications.  DIAGNOSIS    If you are unable to determine the offending allergen, skin or blood testing may find it.  TREATMENT     Avoid the allergen.   Medications and allergy shots (immunotherapy) can help.   Hay fever may often be treated with antihistamines in pill or nasal spray forms. Antihistamines block the effects of histamine. There are over-the-counter medicines that may help with nasal congestion and swelling around the eyes. Check with your caregiver before taking or giving this medicine.  If the treatment above does not work, there are many new medications your caregiver can prescribe. Stronger medications may be used if initial measures are ineffective. Desensitizing injections can be used if medications and avoidance fails. Desensitization is when a patient is given ongoing shots until the body becomes less sensitive to the allergen. Make sure you follow up with your caregiver if problems continue.   SEEK MEDICAL CARE IF:     You develop fever (more than 100.5 F (38.1 C).   You develop a cough that does not stop easily (persistent).   You have shortness of breath.   You start wheezing.   Symptoms interfere with normal daily activities.  Document Released: 04/18/2001 Document Revised: 10/16/2011 Document Reviewed: 10/28/2008  [employer] Patient Information 2013 [employer]

## 2012-11-04 ENCOUNTER — Ambulatory Visit (INDEPENDENT_AMBULATORY_CARE_PROVIDER_SITE_OTHER): Payer: Medicare Other | Admitting: Internal Medicine

## 2012-11-04 ENCOUNTER — Encounter: Payer: Self-pay | Admitting: Internal Medicine

## 2012-11-04 VITALS — BP 128/68 | HR 64 | Temp 98.1°F | Ht 62.0 in | Wt 145.8 lb

## 2012-11-04 DIAGNOSIS — L259 Unspecified contact dermatitis, unspecified cause: Secondary | ICD-10-CM

## 2012-11-04 DIAGNOSIS — L309 Dermatitis, unspecified: Secondary | ICD-10-CM

## 2012-11-04 MED ORDER — PREDNISONE 10 MG PO TABS: ORAL_TABLET | ORAL | Status: DC

## 2012-11-04 MED ORDER — HYDROXYZINE PAMOATE 25 MG PO CAPS: 25.0000 mg | ORAL_CAPSULE | Freq: Three times a day (TID) | ORAL | Status: DC | PRN

## 2012-11-04 NOTE — Patient Instructions (Signed)

## 2012-11-05 ENCOUNTER — Encounter: Payer: Self-pay | Admitting: Internal Medicine

## 2012-11-05 NOTE — Progress Notes (Signed)
Subjective:    Patient ID: Adrian [name], male    DOB: 1941/01/07, 72 y.o.   MRN: [id number]  HPI  Pt presents to the clinic today with c/o a rash that started approximately 1 week ago. He had to go see Dr. Maple Hudson who gave him some Ketoconazole cream on it, but it does not appear to be helping. He describes the rash as red with small bumps and very itchy. He has also taken Benadryl which has helped with the itching. He is not sure what caused this rash. He has not used any new detergent or soaps. He has not taken any new medications.  Review of Systems      Past Medical History  Diagnosis Date  . DIABETES MELLITUS   . HYPERCHOLESTEROLEMIA   . ERECTILE DYSFUNCTION   . 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 ESSENTIAL HYPERTENSION   . GASTRITIS   . RENAL CYST   . Helicobacter pylori (H. pylori)   . Personal history of colonic polyps     Current Outpatient Prescriptions  Medication Sig Dispense Refill  . brimonidine-timolol (COMBIGAN) 0.2-0.5 % ophthalmic solution Place 1 drop into the right eye every 12 (twelve) hours.      . carboxymethylcellulose (REFRESH PLUS) 0.5 % SOLN Place 1 drop into both eyes 3 (three) times daily as needed. For dry eyes.      . Colesevelam HCl (WELCHOL) 3.75 G PACK Take 3.75 g by mouth daily.      . dorzolamide (TRUSOPT) 2 % ophthalmic solution Place 1 drop into the right eye 2 (two) times daily.      . fluocinonide-emollient (LIDEX-E) 0.05 % cream Apply topically 2 (two) times daily.  60 g  2  . glucose blood (FREESTYLE TEST STRIPS) test strip Use BID  100 each  12  . glucose monitoring kit (FREESTYLE) monitoring kit 1 each by Does not apply route 2 (two) times daily.  1 each  3  . hydrOXYzine (VISTARIL) 25 MG capsule Take 1 capsule (25 mg total) by mouth 3 (three) times daily as needed for itching.  65 capsule  2  . ketoconazole (NIZORAL) 2 % cream Apply 1 application topically 2 (two) times daily.      . Lancets (FREESTYLE) lancets 1 each by Other  route 2 (two) times daily. Use as instructed  100 each  11  . levocetirizine (XYZAL) 5 MG tablet Take 1 tablet (5 mg total) by mouth every evening.  30 tablet  0  . losartan-hydrochlorothiazide (HYZAAR) 100-12.5 MG per tablet Take 1 tablet by mouth daily.  30 tablet  6  . naproxen (NAPROSYN) 375 MG tablet Take 375 mg by mouth 2 (two) times daily with a meal.      . omega-3 acid ethyl esters (LOVAZA) 1 G capsule Take 2 g by mouth 2 (two) times daily.      . pravastatin (PRAVACHOL) 80 MG tablet TAKE 1 TABLET BY MOUTH EVERY DAY  90 tablet  3  . Saxagliptin-Metformin (KOMBIGLYZE XR) 12-998 MG TB24 Take 1 tablet by mouth daily.  30 tablet  11  . Travoprost, BAK Free, (TRAVATAN) 0.004 % SOLN ophthalmic solution Place 1 drop into the right eye at bedtime.      . predniSONE (DELTASONE) 10 MG tablet Take 3 pills on days 1-2, take 2 pills on days 3-4, take 1 pill on days 5-6  12 tablet  0   No current facility-administered medications for this visit.    Allergies  Allergen Reactions  .  Lisinopril     REACTION: cough    Family History  Problem Relation Age of Onset  . Diabetes Mother   . Diabetes Brother   . Hypertension Other   . Hearing loss Daughter     History   Social History  . Marital Status: Divorced    Spouse Name: N/A    Number of Children: 7  . Years of Education: N/A   Occupational History  . Retired    Social History Main Topics  . Smoking status: Former Games developer  . Smokeless tobacco: Never Used     Comment: Stopped 30 years ago  . Alcohol Use: No  . Drug Use: No  . Sexually Active: Yes   Other Topics Concern  . Not on file   Social History Narrative  . No narrative on file     Constitutional: Denies fever, malaise, fatigue, headache or abrupt weight changes.  Skin: Pt reports rash. Denies redness, rashes, lesions or ulcercations.   No other specific complaints in a complete review of systems (except as listed in HPI above).  Objective:   Physical  Exam  BP 128/68  Pulse 64  Temp(Src) 98.1 F (36.7 C) (Oral)  Ht 5\' 2"  (1.575 m)  Wt 145 lb 12 oz (66.112 kg)  BMI 26.65 kg/m2  SpO2 96% Wt Readings from Last 3 Encounters:  11/04/12 145 lb 12 oz (66.112 kg)  10/17/12 146 lb (66.225 kg)  10/02/12 147 lb 5 oz (66.821 kg)    General: Appears his stated age, well developed, well nourished in NAD. Skin: Warm, dry and intact. Generalized maculopapular rash noted on the extremities and back. Cardiovascular: Normal rate and rhythm. S1,S2 noted.  No murmur, rubs or gallops noted. No JVD or BLE edema. No carotid bruits noted. Pulmonary/Chest: Normal effort and positive vesicular breath sounds. No respiratory distress. No wheezes, rales or ronchi noted.         Assessment & Plan:  Contact dermatitis of unknown cause, new onset:  Continue Bendaryl as needed Try no to scratch the affected area eRx for pred taper  RTC as needed or if symptoms persist

## 2012-11-27 ENCOUNTER — Other Ambulatory Visit: Payer: Self-pay | Admitting: Internal Medicine

## 2012-11-27 ENCOUNTER — Ambulatory Visit: Payer: Medicare Other | Admitting: Internal Medicine

## 2012-12-12 ENCOUNTER — Other Ambulatory Visit: Payer: Self-pay | Admitting: Internal Medicine

## 2012-12-12 ENCOUNTER — Ambulatory Visit: Payer: Medicare Other | Admitting: Family Medicine

## 2012-12-13 ENCOUNTER — Other Ambulatory Visit (INDEPENDENT_AMBULATORY_CARE_PROVIDER_SITE_OTHER): Payer: Medicare Other

## 2012-12-13 ENCOUNTER — Encounter: Payer: Self-pay | Admitting: Internal Medicine

## 2012-12-13 ENCOUNTER — Ambulatory Visit (INDEPENDENT_AMBULATORY_CARE_PROVIDER_SITE_OTHER)
Admission: RE | Admit: 2012-12-13 | Discharge: 2012-12-13 | Disposition: A | Discharge status: Home / Self Care | Payer: Medicare Other | Source: Ambulatory Visit | Attending: Internal Medicine | Admitting: Internal Medicine

## 2012-12-13 ENCOUNTER — Ambulatory Visit (INDEPENDENT_AMBULATORY_CARE_PROVIDER_SITE_OTHER): Payer: Medicare Other | Admitting: Internal Medicine

## 2012-12-13 VITALS — BP 116/60 | HR 60 | Temp 98.7°F | Resp 16 | Wt 143.5 lb

## 2012-12-13 DIAGNOSIS — L259 Unspecified contact dermatitis, unspecified cause: Secondary | ICD-10-CM

## 2012-12-13 DIAGNOSIS — E7800 Pure hypercholesterolemia, unspecified: Secondary | ICD-10-CM

## 2012-12-13 DIAGNOSIS — K118 Other diseases of salivary glands: Secondary | ICD-10-CM | POA: Insufficient documentation

## 2012-12-13 DIAGNOSIS — L309 Dermatitis, unspecified: Secondary | ICD-10-CM

## 2012-12-13 DIAGNOSIS — R221 Localized swelling, mass and lump, neck: Secondary | ICD-10-CM

## 2012-12-13 DIAGNOSIS — R220 Localized swelling, mass and lump, head: Secondary | ICD-10-CM

## 2012-12-13 DIAGNOSIS — I10 Essential (primary) hypertension: Secondary | ICD-10-CM

## 2012-12-13 DIAGNOSIS — E781 Pure hyperglyceridemia: Secondary | ICD-10-CM

## 2012-12-13 DIAGNOSIS — K1120 Sialoadenitis, unspecified: Secondary | ICD-10-CM

## 2012-12-13 DIAGNOSIS — E119 Type 2 diabetes mellitus without complications: Secondary | ICD-10-CM

## 2012-12-13 HISTORY — DX: Other diseases of salivary glands: K11.8

## 2012-12-13 HISTORY — DX: Sialoadenitis, unspecified: K11.20

## 2012-12-13 LAB — COMPREHENSIVE METABOLIC PANEL
AST: 25 U/L (ref 0–37)
Albumin: 3.6 g/dL (ref 3.5–5.2)
Alkaline Phosphatase: 36 U/L — ABNORMAL LOW (ref 39–117)
Calcium: 8.6 mg/dL (ref 8.4–10.5)
Chloride: 108 mEq/L (ref 96–112)
Glucose, Bld: 98 mg/dL (ref 70–99)
Potassium: 3.9 mEq/L (ref 3.5–5.1)
Sodium: 140 mEq/L (ref 135–145)
Total Protein: 6.2 g/dL (ref 6.0–8.3)

## 2012-12-13 LAB — CBC WITH DIFFERENTIAL/PLATELET
Eosinophils Relative: 4.6 % (ref 0.0–5.0)
Lymphocytes Relative: 31.2 % (ref 12.0–46.0)
MCV: 94.7 fl (ref 78.0–100.0)
Monocytes Absolute: 0.7 10*3/uL (ref 0.1–1.0)
Neutrophils Relative %: 50.4 % (ref 43.0–77.0)
Platelets: 178 10*3/uL (ref 150.0–400.0)
RBC: 4.32 Mil/uL (ref 4.22–5.81)
WBC: 5.2 10*3/uL (ref 4.5–10.5)

## 2012-12-13 MED ORDER — AMOXICILLIN-POT CLAVULANATE 875-125 MG PO TABS: 1.0000 | ORAL_TABLET | Freq: Two times a day (BID) | ORAL | Status: DC

## 2012-12-13 MED ORDER — IOHEXOL 300 MG/ML  SOLN
75.0000 mL | Freq: Once | INTRAMUSCULAR | Status: AC | PRN
  Administered 2012-12-13: 75 mL via INTRAVENOUS

## 2012-12-13 NOTE — Assessment & Plan Note (Signed)
His BP is well controlled Today I will check his lytes and renal function 

## 2012-12-13 NOTE — Patient Instructions (Signed)
Salivary Gland Infection  A salivary gland infection can be caused by a virus, bacteria from the mouth, or a stone. Mumps and other viruses may settle in one or more of the saliva glands. This will result in swelling, pain, and difficulty eating. Bacteria may cause a more severe infection in a salivary gland. A salivary stone blocking the flow of saliva can make this worse. These infections may be related to other medical problems. Some of these are dehydration, recent surgery, poor nutrition, and some medications.  TREATMENT   Treatment of a salivary gland infection depends on the cause. Mumps and other virus infections do not require antibiotics. If bacteria cause the infection, then antibiotics are needed to get rid of the infection. If there is a salivary stone blocking the duct, minor surgery to remove the stone may be needed.   HOME CARE INSTRUCTIONS    Get plenty of rest, increase your fluids, and use warm compresses on the swollen area for 15 to 20 minutes 4 times per day or as often as feels good to you.   Suck on hard candy or chew sugarless gum to promote saliva production.   Only take over-the-counter or prescription medicines for pain, discomfort, or fever as directed by your caregiver.  SEEK IMMEDIATE MEDICAL CARE IF:    You have increased swelling or pain or pain not relieved with medications.   You develop chills or a fever.   Any of your problems are getting worse rather than better.  Document Released: 08/31/2004 Document Revised: 10/16/2011 Document Reviewed: 07/24/2005  Exit[employer] Patient Information 2013 [employer].

## 2012-12-13 NOTE — Assessment & Plan Note (Signed)
I have asked him to see a dermatologist about this

## 2012-12-13 NOTE — Progress Notes (Signed)
Subjective:    Patient ID: Adrian [name], male    DOB: 1940/09/25, 72 y.o.   MRN: [id number]  HPI  He returns c/o a painful, enlarging node below his left ear for 1-2 weeks, the area feels more painful when he eats something sour and he salivates.  Review of Systems  Constitutional: Negative for fever, chills, diaphoresis, activity change, appetite change, fatigue and unexpected weight change.  HENT: Negative.   Eyes: Negative.   Respiratory: Negative.  Negative for cough, chest tightness, shortness of breath, wheezing and stridor.   Cardiovascular: Negative.  Negative for chest pain, palpitations and leg swelling.  Gastrointestinal: Negative.  Negative for nausea, vomiting, abdominal pain and diarrhea.  Endocrine: Negative.   Genitourinary: Negative.   Musculoskeletal: Negative.   Skin: Positive for rash (itchy rash persists). Negative for color change, pallor and wound.  Allergic/Immunologic: Negative.   Neurological: Negative.  Negative for dizziness, tremors, weakness and light-headedness.  Hematological: Positive for adenopathy. Does not bruise/bleed easily.  Psychiatric/Behavioral: Negative.        Objective:   Physical Exam  Vitals reviewed. Constitutional: He is oriented to person, place, and time. He appears well-developed and well-nourished. No distress.  HENT:  Head: Normocephalic and atraumatic.  Mouth/Throat: Oropharynx is clear and moist. No oropharyngeal exudate.  Eyes: Conjunctivae are normal. Right eye exhibits no discharge. Left eye exhibits no discharge. No scleral icterus.  Neck: Normal range of motion. Neck supple. No JVD present. No tracheal deviation present. No mass and no thyromegaly present.    Below the left ear there is a 4 cm mass that is well defined, freely mobile, soft with some induration, slightly tender, the overlying skin is normal, this may be a node or a salivary gland  Cardiovascular: Normal rate, regular rhythm, normal heart sounds and  intact distal pulses.  Exam reveals no gallop and no friction rub.   No murmur heard. Pulmonary/Chest: Effort normal and breath sounds normal. No stridor. No respiratory distress. He has no wheezes. He has no rales. He exhibits no tenderness.  Abdominal: Soft. Bowel sounds are normal. He exhibits no distension and no mass. There is no tenderness. There is no rebound and no guarding.  Musculoskeletal: Normal range of motion. He exhibits no edema and no tenderness.  Lymphadenopathy:    He has no cervical adenopathy.  Neurological: He is oriented to person, place, and time.  Skin: Skin is warm, dry and intact. Rash noted. No purpura noted. Rash is papular. Rash is not macular, not maculopapular, not nodular, not pustular, not vesicular and not urticarial. He is not diaphoretic. No pallor.  There are papules on the dorsum of both hands and wrists  Psychiatric: He has a normal mood and affect. His behavior is normal. Judgment and thought content normal.      Lab Results  Component Value Date   WBC 5.6 04/29/2012   HGB 13.8 04/29/2012   HCT 40.6 04/29/2012   PLT 160 04/29/2012   GLUCOSE 161* 08/28/2012   CHOL 160 01/30/2012   TRIG 84.0 01/30/2012   HDL 67.80 01/30/2012   LDLDIRECT 116.6 11/10/2010   LDLCALC 75 01/30/2012   ALT 31 08/28/2012   AST 24 08/28/2012   NA 139 08/28/2012   K 3.4* 08/28/2012   CL 103 08/28/2012   CREATININE 0.8 08/28/2012   BUN 21 08/28/2012   CO2 28 08/28/2012   TSH 2.52 01/30/2012   PSA 1.68 05/18/2010   HGBA1C 6.1 08/28/2012   MICROALBUR 0.9 09/14/2009  Assessment & Plan:

## 2012-12-13 NOTE — Assessment & Plan Note (Signed)
He is due for an A1C today

## 2012-12-13 NOTE — Addendum Note (Signed)
Addended by: Etta Grandchild on: 12/13/2012 03:59 PM   Modules accepted: Orders

## 2012-12-13 NOTE — Assessment & Plan Note (Signed)
I will check his labs today to look for inflammation/infection I have also ordered a CT scan to see if this is infection or malignancy

## 2012-12-13 NOTE — Assessment & Plan Note (Signed)
Start augmentin for an apparent infection I have ordered a CT scan to check for a mass, stone, obstruction

## 2012-12-17 ENCOUNTER — Telehealth: Payer: Self-pay

## 2012-12-17 ENCOUNTER — Other Ambulatory Visit: Payer: Self-pay | Admitting: Internal Medicine

## 2012-12-17 DIAGNOSIS — L309 Dermatitis, unspecified: Secondary | ICD-10-CM

## 2012-12-17 NOTE — Telephone Encounter (Signed)
Wife called to obtain appointment information for ENT. I informed her that a VM was left on Friday 12/13/12 given appointment information for 12/16/12. Pt missed appointment, gave phone number to call and reschedule.  Wife then mentioned that pt went to dermatology referral (Dr. Arita Miss) and was not please. She stated that he was seen by this group before and was not pleased. Pt would like referral to a different dermatologist. Thanks

## 2012-12-19 NOTE — Telephone Encounter (Signed)
New referral placed by MD, [facility] to notify of appt information

## 2013-01-15 ENCOUNTER — Other Ambulatory Visit (HOSPITAL_COMMUNITY)
Admission: RE | Admit: 2013-01-15 | Discharge: 2013-01-15 | Disposition: A | Discharge status: Home / Self Care | Payer: Medicare Other | Source: Ambulatory Visit | Attending: Otolaryngology | Admitting: Otolaryngology

## 2013-01-15 ENCOUNTER — Other Ambulatory Visit: Payer: Self-pay | Admitting: Otolaryngology

## 2013-01-15 DIAGNOSIS — K119 Disease of salivary gland, unspecified: Secondary | ICD-10-CM | POA: Insufficient documentation

## 2013-01-23 ENCOUNTER — Other Ambulatory Visit: Payer: Self-pay | Admitting: Otolaryngology

## 2013-01-25 ENCOUNTER — Other Ambulatory Visit: Payer: Self-pay | Admitting: Internal Medicine

## 2013-02-17 LAB — HM DIABETES EYE EXAM

## 2013-03-06 ENCOUNTER — Other Ambulatory Visit: Payer: Self-pay | Admitting: Internal Medicine

## 2013-05-08 ENCOUNTER — Ambulatory Visit (INDEPENDENT_AMBULATORY_CARE_PROVIDER_SITE_OTHER): Payer: Medicare Other

## 2013-05-08 DIAGNOSIS — Z23 Encounter for immunization: Secondary | ICD-10-CM

## 2013-05-17 ENCOUNTER — Other Ambulatory Visit: Payer: Self-pay | Admitting: Internal Medicine

## 2013-05-29 ENCOUNTER — Encounter: Payer: Self-pay | Admitting: Internal Medicine

## 2013-05-29 ENCOUNTER — Ambulatory Visit (INDEPENDENT_AMBULATORY_CARE_PROVIDER_SITE_OTHER): Payer: Medicare Other | Admitting: Internal Medicine

## 2013-05-29 ENCOUNTER — Ambulatory Visit (INDEPENDENT_AMBULATORY_CARE_PROVIDER_SITE_OTHER)
Admission: RE | Admit: 2013-05-29 | Discharge: 2013-05-29 | Disposition: A | Discharge status: Home / Self Care | Payer: Medicare Other | Source: Ambulatory Visit | Attending: Internal Medicine | Admitting: Internal Medicine

## 2013-05-29 VITALS — BP 139/82 | HR 57 | Temp 97.8°F | Resp 16 | Ht 62.0 in | Wt 147.0 lb

## 2013-05-29 DIAGNOSIS — M1990 Unspecified osteoarthritis, unspecified site: Secondary | ICD-10-CM

## 2013-05-29 DIAGNOSIS — M79609 Pain in unspecified limb: Secondary | ICD-10-CM

## 2013-05-29 DIAGNOSIS — N400 Enlarged prostate without lower urinary tract symptoms: Secondary | ICD-10-CM | POA: Insufficient documentation

## 2013-05-29 DIAGNOSIS — M79645 Pain in left finger(s): Secondary | ICD-10-CM

## 2013-05-29 DIAGNOSIS — L309 Dermatitis, unspecified: Secondary | ICD-10-CM

## 2013-05-29 DIAGNOSIS — IMO0001 Reserved for inherently not codable concepts without codable children: Secondary | ICD-10-CM

## 2013-05-29 DIAGNOSIS — Z0000 Encounter for general adult medical examination without abnormal findings: Secondary | ICD-10-CM

## 2013-05-29 DIAGNOSIS — Z23 Encounter for immunization: Secondary | ICD-10-CM

## 2013-05-29 DIAGNOSIS — M79646 Pain in unspecified finger(s): Secondary | ICD-10-CM | POA: Insufficient documentation

## 2013-05-29 DIAGNOSIS — E7800 Pure hypercholesterolemia, unspecified: Secondary | ICD-10-CM

## 2013-05-29 DIAGNOSIS — E781 Pure hyperglyceridemia: Secondary | ICD-10-CM

## 2013-05-29 DIAGNOSIS — L259 Unspecified contact dermatitis, unspecified cause: Secondary | ICD-10-CM

## 2013-05-29 DIAGNOSIS — I10 Essential (primary) hypertension: Secondary | ICD-10-CM

## 2013-05-29 HISTORY — DX: Pain in unspecified finger(s): M79.646

## 2013-05-29 HISTORY — DX: Benign prostatic hyperplasia without lower urinary tract symptoms: N40.0

## 2013-05-29 MED ORDER — FLUOCINONIDE-E 0.05 % EX CREA: TOPICAL_CREAM | Freq: Two times a day (BID) | CUTANEOUS | Status: DC

## 2013-05-29 NOTE — Patient Instructions (Signed)

## 2013-05-30 ENCOUNTER — Encounter: Payer: Self-pay | Admitting: Internal Medicine

## 2013-05-30 DIAGNOSIS — Z0000 Encounter for general adult medical examination without abnormal findings: Secondary | ICD-10-CM | POA: Insufficient documentation

## 2013-05-30 NOTE — Progress Notes (Signed)
Subjective:    Patient ID: Adrian [name], male    DOB: 08-01-1941, 72 y.o.   MRN: [id number]  Arthritis Presents for follow-up visit. He complains of pain. He reports no stiffness, joint swelling or joint warmth. Affected location: LMF PIP joint. His pain is at a severity of 2/10. Associated symptoms include rash (on right thumb). Pertinent negatives include no diarrhea, dry eyes, dry mouth, dysuria, fatigue, fever, pain at night, pain while resting, Raynaud's syndrome, uveitis or weight loss. His past medical history is significant for osteoarthritis. His pertinent risk factors include overuse. Past treatments include nothing. The treatment provided no relief. Factors aggravating his arthritis include activity.      Review of Systems  Constitutional: Negative.  Negative for fever, chills, weight loss, diaphoresis, appetite change and fatigue.  HENT: Negative.   Eyes: Negative.   Respiratory: Negative.  Negative for cough, chest tightness, shortness of breath, wheezing and stridor.   Cardiovascular: Negative.  Negative for chest pain, palpitations and leg swelling.  Gastrointestinal: Negative.  Negative for nausea, vomiting, abdominal pain, diarrhea, constipation and blood in stool.  Endocrine: Negative.  Negative for polydipsia, polyphagia and polyuria.  Genitourinary: Negative.  Negative for dysuria, urgency, frequency, hematuria, scrotal swelling, difficulty urinating, genital sores and testicular pain.  Musculoskeletal: Positive for arthralgias and arthritis. Negative for back pain, gait problem, joint swelling, myalgias, neck pain, neck stiffness and stiffness.  Skin: Positive for rash (on right thumb). Negative for color change, pallor and wound.  Allergic/Immunologic: Negative.   Neurological: Negative.  Negative for dizziness, tremors, syncope, speech difficulty, weakness, light-headedness, numbness and headaches.  Hematological: Negative.  Negative for adenopathy. Does not  bruise/bleed easily.  Psychiatric/Behavioral: Negative.        Objective:   Physical Exam  Vitals reviewed. Constitutional: He appears well-developed and well-nourished. No distress.  HENT:  Head: Normocephalic and atraumatic.  Mouth/Throat: Oropharynx is clear and moist. No oropharyngeal exudate.  Eyes: Conjunctivae are normal. Right eye exhibits no discharge. Left eye exhibits no discharge. No scleral icterus.  Neck: Normal range of motion. Neck supple. No JVD present. No tracheal deviation present. No thyromegaly present.  Cardiovascular: Normal rate, regular rhythm, normal heart sounds and intact distal pulses.  Exam reveals no gallop and no friction rub.   No murmur heard. Pulmonary/Chest: Effort normal and breath sounds normal. No stridor. No respiratory distress. He has no wheezes. He has no rales. He exhibits no tenderness.  Abdominal: Soft. Bowel sounds are normal. He exhibits no distension and no mass. There is no tenderness. There is no rebound and no guarding. Hernia confirmed negative in the right inguinal area and confirmed negative in the left inguinal area.  Genitourinary: Rectum normal, prostate normal, testes normal and penis normal. Rectal exam shows no external hemorrhoid, no internal hemorrhoid, no fissure, no mass, no tenderness and anal tone normal. Guaiac negative stool. Prostate is not enlarged and not tender. Right testis shows no mass, no swelling and no tenderness. Right testis is descended. Left testis shows no mass, no swelling and no tenderness. Left testis is descended. Uncircumcised. No phimosis, paraphimosis, hypospadias, penile erythema or penile tenderness. No discharge found.  Musculoskeletal: Normal range of motion. He exhibits no edema and no tenderness.       Left hand: Normal. He exhibits normal range of motion, no tenderness, no bony tenderness, normal two-point discrimination, normal capillary refill, no deformity, no laceration and no swelling. Normal  sensation noted. Normal strength noted.  LMF - + heberden's and bouchard's nodes  Lymphadenopathy:  He has no cervical adenopathy.       Right: No inguinal adenopathy present.       Left: No inguinal adenopathy present.  Skin: Rash noted. No purpura noted. Rash is papular. Rash is not macular, not maculopapular, not nodular, not pustular, not vesicular and not urticarial. He is not diaphoretic.  On the dorsum of the right thumb there are scaly papules          Assessment & Plan:

## 2013-05-30 NOTE — Assessment & Plan Note (Signed)
Exam and xray are c/w DJD

## 2013-05-30 NOTE — Assessment & Plan Note (Signed)
FLP today 

## 2013-05-30 NOTE — Assessment & Plan Note (Signed)
PSA today

## 2013-05-30 NOTE — Assessment & Plan Note (Signed)
His BP is well controlled Today I will check his lytes and renal function 

## 2013-05-30 NOTE — Assessment & Plan Note (Signed)
I will check his A1C and will advise further 

## 2013-05-30 NOTE — Assessment & Plan Note (Signed)

## 2013-05-30 NOTE — Assessment & Plan Note (Signed)
He will restart nsaids and apap

## 2013-05-30 NOTE — Assessment & Plan Note (Signed)
Restart a topical steroid cream

## 2013-06-08 ENCOUNTER — Other Ambulatory Visit: Payer: Self-pay | Admitting: Internal Medicine

## 2013-06-18 ENCOUNTER — Other Ambulatory Visit (INDEPENDENT_AMBULATORY_CARE_PROVIDER_SITE_OTHER): Payer: Medicare Other

## 2013-06-18 ENCOUNTER — Encounter: Payer: Self-pay | Admitting: Internal Medicine

## 2013-06-18 ENCOUNTER — Ambulatory Visit (INDEPENDENT_AMBULATORY_CARE_PROVIDER_SITE_OTHER): Payer: Medicare Other | Admitting: Internal Medicine

## 2013-06-18 VITALS — BP 110/68 | HR 56 | Temp 97.5°F | Resp 16 | Ht 62.0 in | Wt 151.0 lb

## 2013-06-18 DIAGNOSIS — K589 Irritable bowel syndrome without diarrhea: Secondary | ICD-10-CM

## 2013-06-18 DIAGNOSIS — N400 Enlarged prostate without lower urinary tract symptoms: Secondary | ICD-10-CM

## 2013-06-18 DIAGNOSIS — E781 Pure hyperglyceridemia: Secondary | ICD-10-CM

## 2013-06-18 DIAGNOSIS — I10 Essential (primary) hypertension: Secondary | ICD-10-CM

## 2013-06-18 DIAGNOSIS — E7800 Pure hypercholesterolemia, unspecified: Secondary | ICD-10-CM

## 2013-06-18 DIAGNOSIS — IMO0001 Reserved for inherently not codable concepts without codable children: Secondary | ICD-10-CM

## 2013-06-18 LAB — URINALYSIS, ROUTINE W REFLEX MICROSCOPIC
Bilirubin Urine: NEGATIVE
Hgb urine dipstick: NEGATIVE
Nitrite: NEGATIVE
Total Protein, Urine: NEGATIVE
Urine Glucose: NEGATIVE
Urobilinogen, UA: 0.2 (ref 0.0–1.0)
pH: 7.5 (ref 5.0–8.0)

## 2013-06-18 LAB — COMPREHENSIVE METABOLIC PANEL
ALT: 40 U/L (ref 0–53)
CO2: 32 mEq/L (ref 19–32)
Calcium: 9.7 mg/dL (ref 8.4–10.5)
Chloride: 100 mEq/L (ref 96–112)
Creatinine, Ser: 0.8 mg/dL (ref 0.4–1.5)
GFR: 101.8 mL/min (ref >60.00)
Glucose, Bld: 106 mg/dL — ABNORMAL HIGH (ref 70–99)
Potassium: 3.8 mEq/L (ref 3.5–5.1)
Sodium: 138 mEq/L (ref 135–145)
Total Protein: 6.9 g/dL (ref 6.0–8.3)

## 2013-06-18 LAB — CBC WITH DIFFERENTIAL/PLATELET
Basophils Absolute: 0 10*3/uL (ref 0.0–0.1)
Lymphocytes Relative: 28.7 % (ref 12.0–46.0)
Monocytes Relative: 11.6 % (ref 3.0–12.0)
Platelets: 194 10*3/uL (ref 150.0–400.0)
RDW: 13.2 % (ref 11.5–14.6)

## 2013-06-18 LAB — HEMOGLOBIN A1C: Hgb A1c MFr Bld: 6.2 % (ref 4.6–6.5)

## 2013-06-18 MED ORDER — GLYCOPYRROLATE 1 MG PO TABS: 1.0000 mg | ORAL_TABLET | Freq: Three times a day (TID) | ORAL | Status: DC

## 2013-06-18 NOTE — Progress Notes (Signed)
Pre visit review using our clinic review tool, if applicable. No additional management support is needed unless otherwise documented below in the visit note. 

## 2013-06-18 NOTE — Patient Instructions (Signed)
Irritable Bowel Syndrome °Irritable Bowel Syndrome (IBS) is caused by a disturbance of normal bowel function. Other terms used are spastic colon, mucous colitis, and irritable colon. It does not require surgery, nor does it lead to cancer. There is no cure for IBS. But with proper diet, stress reduction, and medication, you will find that your problems (symptoms) will gradually disappear or improve. IBS is a common digestive disorder. It usually appears in late adolescence or early adulthood. Women develop it twice as often as men. °CAUSES  °After food has been digested and absorbed in the small intestine, waste material is moved into the colon (large intestine). In the colon, water and salts are absorbed from the undigested products coming from the small intestine. The remaining residue, or fecal material, is held for elimination. Under normal circumstances, gentle, rhythmic contractions on the bowel walls push the fecal material along the colon towards the rectum. In IBS, however, these contractions are irregular and poorly coordinated. The fecal material is either retained too long, resulting in constipation, or expelled too soon, producing diarrhea. °SYMPTOMS  °The most common symptom of IBS is pain. It is typically in the lower left side of the belly (abdomen). But it may occur anywhere in the abdomen. It can be felt as heartburn, backache, or even as a dull pain in the arms or shoulders. The pain comes from excessive bowel-muscle spasms and from the buildup of gas and fecal material in the colon. This pain: °· Can range from sharp belly (abdominal) cramps to a dull, continuous ache. °· Usually worsens soon after eating. °· Is typically relieved by having a bowel movement or passing gas. °Abdominal pain is usually accompanied by constipation. But it may also produce diarrhea. The diarrhea typically occurs right after a meal or upon arising in the morning. The stools are typically soft and watery. They are often  flecked with secretions (mucus). °Other symptoms of IBS include: °· Bloating. °· Loss of appetite. °· Heartburn. °· Feeling sick to your stomach (nausea). °· Belching °· Vomiting °· Gas. °IBS may also cause a number of symptoms that are unrelated to the digestive system: °· Fatigue. °· Headaches. °· Anxiety °· Shortness of breath °· Difficulty in concentrating. °· Dizziness. °These symptoms tend to come and go. °DIAGNOSIS  °The symptoms of IBS closely mimic the symptoms of other, more serious digestive disorders. So your caregiver may wish to perform a variety of additional tests to exclude these disorders. He/she wants to be certain of learning what is wrong (diagnosis). The nature and purpose of each test will be explained to you. °TREATMENT °A number of medications are available to help correct bowel function and/or relieve bowel spasms and abdominal pain. Among the drugs available are: °· Mild, non-irritating laxatives for severe constipation and to help restore normal bowel habits. °· Specific anti-diarrheal medications to treat severe or prolonged diarrhea. °· Anti-spasmodic agents to relieve intestinal cramps. °· Your caregiver may also decide to treat you with a mild tranquilizer or sedative during unusually stressful periods in your life. °The important thing to remember is that if any drug is prescribed for you, make sure that you take it exactly as directed. Make sure that your caregiver knows how well it worked for you. °HOME CARE INSTRUCTIONS  °· Avoid foods that are high in fat or oils. Some examples are:heavy cream, butter, frankfurters, sausage, and other fatty meats. °· Avoid foods that have a laxative effect, such as fruit, fruit juice, and dairy products. °· Cut out   carbonated drinks, chewing gum, and "gassy" foods, such as beans and cabbage. This may help relieve bloating and belching. °· Bran taken with plenty of liquids may help relieve constipation. °· Keep track of what foods seem to trigger  your symptoms. °· Avoid emotionally charged situations or circumstances that produce anxiety. °· Start or continue exercising. °· Get plenty of rest and sleep. °MAKE SURE YOU:  °· Understand these instructions. °· Will watch your condition. °· Will get help right away if you are not doing well or get worse. °Document Released: 07/24/2005 Document Revised: 10/16/2011 Document Reviewed: 03/13/2008 °[employer]® Patient Information ©2014 [employer]. ° °

## 2013-06-18 NOTE — Progress Notes (Signed)
Subjective:    Patient ID: Adrian [name], male    DOB: 08-29-1940, 72 y.o.   MRN: [id number]  Abdominal Pain This is a chronic problem. The current episode started more than 1 year ago. The onset quality is gradual. The problem occurs intermittently. The problem has been unchanged. The pain is located in the generalized abdominal region. The pain is at a severity of 1/10. The pain is mild. The quality of the pain is aching. The abdominal pain does not radiate. Pertinent negatives include no anorexia, arthralgias, belching, constipation, diarrhea, dysuria, fever, flatus, frequency, headaches, hematochezia, hematuria, melena, myalgias, nausea, vomiting or weight loss. Nothing aggravates the pain. The pain is relieved by nothing. Prior diagnostic workup includes CT scan, GI consult, lower endoscopy and upper endoscopy. His past medical history is significant for irritable bowel syndrome. There is no history of abdominal surgery, colon cancer, Crohn's disease, gallstones, GERD, pancreatitis, PUD or ulcerative colitis.      Review of Systems  Constitutional: Positive for unexpected weight change (wt gain). Negative for fever, chills, weight loss, diaphoresis, activity change, appetite change and fatigue.  HENT: Negative.   Eyes: Negative.   Respiratory: Negative.  Negative for cough, chest tightness, shortness of breath, wheezing and stridor.   Cardiovascular: Negative.  Negative for chest pain, palpitations and leg swelling.  Gastrointestinal: Positive for abdominal pain. Negative for nausea, vomiting, diarrhea, constipation, blood in stool, melena, hematochezia, abdominal distention, anal bleeding, rectal pain, anorexia and flatus.  Endocrine: Negative.  Negative for polydipsia, polyphagia and polyuria.  Genitourinary: Negative.  Negative for dysuria, urgency, frequency, hematuria, flank pain, decreased urine volume, discharge, penile swelling, scrotal swelling, enuresis, difficulty urinating,  genital sores and penile pain.  Musculoskeletal: Negative.  Negative for arthralgias and myalgias.  Skin: Negative.   Allergic/Immunologic: Negative.   Neurological: Negative.  Negative for dizziness, weakness, light-headedness, numbness and headaches.  Hematological: Negative.  Negative for adenopathy. Does not bruise/bleed easily.  Psychiatric/Behavioral: Negative.        Objective:   Physical Exam  Vitals reviewed. Constitutional: He is oriented to person, place, and time. He appears well-developed and well-nourished.  Non-toxic appearance. He does not have a sickly appearance. He does not appear ill. No distress.  HENT:  Head: Normocephalic and atraumatic.  Mouth/Throat: Oropharynx is clear and moist. No oropharyngeal exudate.  Eyes: Conjunctivae are normal. Right eye exhibits no discharge. Left eye exhibits no discharge. No scleral icterus.  Neck: Normal range of motion. Neck supple. No JVD present. No tracheal deviation present. No thyromegaly present.  Cardiovascular: Normal rate, regular rhythm, normal heart sounds and intact distal pulses.  Exam reveals no gallop and no friction rub.   No murmur heard. Pulmonary/Chest: Effort normal and breath sounds normal. No stridor. No respiratory distress. He has no wheezes. He has no rales. He exhibits no tenderness.  Abdominal: Soft. Normal appearance and bowel sounds are normal. He exhibits no shifting dullness, no distension, no pulsatile liver, no fluid wave, no abdominal bruit, no ascites, no pulsatile midline mass and no mass. There is no hepatosplenomegaly, splenomegaly or hepatomegaly. There is no tenderness. There is no rebound, no guarding and no CVA tenderness. No hernia. Hernia confirmed negative in the ventral area.  Musculoskeletal: Normal range of motion. He exhibits no edema and no tenderness.  Lymphadenopathy:    He has no cervical adenopathy.  Neurological: He is oriented to person, place, and time.  Skin: Skin is warm and  dry. No rash noted. He is not diaphoretic. No erythema. No pallor.  Psychiatric: He has a normal mood and affect. His behavior is normal. Judgment and thought content normal.      Lab Results  Component Value Date   WBC 5.2 12/13/2012   HGB 13.7 12/13/2012   HCT 40.9 12/13/2012   PLT 178.0 12/13/2012   GLUCOSE 98 12/13/2012   CHOL 160 01/30/2012   TRIG 84.0 01/30/2012   HDL 67.80 01/30/2012   LDLDIRECT 116.6 11/10/2010   LDLCALC 75 01/30/2012   ALT 27 12/13/2012   AST 25 12/13/2012   NA 140 12/13/2012   K 3.9 12/13/2012   CL 108 12/13/2012   CREATININE 0.8 12/13/2012   BUN 21 12/13/2012   CO2 25 12/13/2012   TSH 2.04 12/13/2012   PSA 1.68 05/18/2010   HGBA1C 6.1 12/13/2012   MICROALBUR 0.9 09/14/2009      Assessment & Plan:

## 2013-06-19 NOTE — Assessment & Plan Note (Signed)
He will try robinul for the discomfort

## 2013-06-19 NOTE — Assessment & Plan Note (Signed)
I will check his A1C and will address this if needed

## 2013-06-19 NOTE — Assessment & Plan Note (Signed)
His BP is well controlled I will check his lytes and renal function today 

## 2013-06-20 ENCOUNTER — Telehealth: Payer: Self-pay | Admitting: *Deleted

## 2013-06-20 DIAGNOSIS — K589 Irritable bowel syndrome without diarrhea: Secondary | ICD-10-CM

## 2013-06-20 MED ORDER — NORTRIPTYLINE HCL 10 MG PO CAPS: 10.0000 mg | ORAL_CAPSULE | Freq: Every day | ORAL | Status: DC

## 2013-06-20 NOTE — Telephone Encounter (Signed)
Try pamelor

## 2013-06-20 NOTE — Telephone Encounter (Signed)
Unable to contact pt, no answer and no VM 

## 2013-06-20 NOTE — Telephone Encounter (Signed)
Adrian [name] called states pts current pain medication is not effective.  Please advise

## 2013-07-17 ENCOUNTER — Encounter: Payer: Self-pay | Admitting: Internal Medicine

## 2013-07-17 ENCOUNTER — Ambulatory Visit (INDEPENDENT_AMBULATORY_CARE_PROVIDER_SITE_OTHER): Payer: Medicare Other | Admitting: Internal Medicine

## 2013-07-17 VITALS — BP 140/78 | HR 68 | Temp 97.0°F | Ht 62.0 in | Wt 151.0 lb

## 2013-07-17 DIAGNOSIS — R21 Rash and other nonspecific skin eruption: Secondary | ICD-10-CM

## 2013-07-17 DIAGNOSIS — G8929 Other chronic pain: Secondary | ICD-10-CM

## 2013-07-17 DIAGNOSIS — I10 Essential (primary) hypertension: Secondary | ICD-10-CM

## 2013-07-17 DIAGNOSIS — R109 Unspecified abdominal pain: Secondary | ICD-10-CM

## 2013-07-17 HISTORY — DX: Rash and other nonspecific skin eruption: R21

## 2013-07-17 MED ORDER — METHYLPREDNISOLONE ACETATE 80 MG/ML IJ SUSP
80.0000 mg | Freq: Once | INTRAMUSCULAR | Status: AC
  Administered 2013-07-17: 80 mg via INTRAMUSCULAR

## 2013-07-17 NOTE — Assessment & Plan Note (Signed)
Exam benign, but with persistent discomfort, not clear if related but ? Worse with worsening rash as well?  Very unusual problem I suspect, likely to need tertiary eval - for referral [facility], as well pain clinic locally as well, will hold on further labs at this time as has had extensive testing over yrs I dont have records, but an unusual vasculitis?

## 2013-07-17 NOTE — Assessment & Plan Note (Signed)
stable overall by history and exam, recent data reviewed with pt, and pt to continue medical treatment as before,  to f/u any worsening symptoms or concerns BP Readings from Last 3 Encounters:  07/17/13 140/78  06/18/13 110/68  05/29/13 139/82

## 2013-07-17 NOTE — Patient Instructions (Addendum)
You had the steroid shot today  Please take all new medication as prescribed - the prednisone  OK to stop the nortryptilene  Please continue all other medications as before, and refills have been done if requested.  Please have the pharmacy call with any other refills you may need.  You will be contacted regarding the referral for: Dermatology - [facility], and local Pain Clinic

## 2013-07-17 NOTE — Progress Notes (Signed)
Pre-visit discussion using our clinic review tool. No additional management support is needed unless otherwise documented below in the visit note.  

## 2013-07-17 NOTE — Progress Notes (Signed)
Subjective:    Patient ID: Adrian [name], male    DOB: [birth year], 72 y.o.   MRN: [id number]  HPI  Here with cont'd co abd pain, chronic recurring for several yrs with extensive prior eval by several specialists, including last apparently per Dr Patterson/GI in April 2013 with subsequent referral to pain clinic, to which he could not continue due to cost.  Was most recently tx with low dose nortryptilene which is no help, and seemed to make him feel worse though hard to be specific. Pain currently diffuse, mild to mod without n/v, wt loss, diarrhea or blood.  Also with c/o rash to extremities again o/w unexplained per pt with itching worse recently, rash worse with scratching, has seen derm for this as well. Past Medical History  Diagnosis Date  . DIABETES MELLITUS   . HYPERCHOLESTEROLEMIA   . ERECTILE DYSFUNCTION   . 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 ESSENTIAL HYPERTENSION   . GASTRITIS   . RENAL CYST   . Helicobacter pylori (H. pylori)   . Personal history of colonic polyps    Past Surgical History  Procedure Laterality Date  . Left rotator cuff repair    . Right wrist surgery    . Eye surgery      (R) glaucoma (to relieve pressure)  . Cataract extraction    . Hernia repair      right inguinal  . Inguinal hernia repair  04/29/2012    Procedure: HERNIA REPAIR INGUINAL ADULT;  Surgeon: Wilmon Arms. Corliss Skains, MD;  Location: [facility] OR;  Service: General;  Laterality: Left;  Left inguinal hernia repair w/mesh    reports that he has quit smoking. He has never used smokeless tobacco. He reports that he does not drink alcohol or use illicit drugs. family history includes Diabetes in his brother and mother; Hearing loss in his daughter; Hypertension in his other. There is no history of Cancer, Alcohol abuse, Early death, Heart disease, Hyperlipidemia, or Stroke. Allergies  Allergen Reactions  . Lisinopril     REACTION: cough   Current Outpatient Prescriptions on File Prior to Visit    Medication Sig Dispense Refill  . brimonidine-timolol (COMBIGAN) 0.2-0.5 % ophthalmic solution Place 1 drop into the right eye every 12 (twelve) hours.      . carboxymethylcellulose (REFRESH PLUS) 0.5 % SOLN Place 1 drop into both eyes 3 (three) times daily as needed. For dry eyes.      . dorzolamide (TRUSOPT) 2 % ophthalmic solution Place 1 drop into the right eye 2 (two) times daily.      . fluocinonide-emollient (LIDEX-E) 0.05 % cream Apply topically 2 (two) times daily.  60 g  2  . glucose blood (FREESTYLE TEST STRIPS) test strip Use BID  100 each  12  . glucose monitoring kit (FREESTYLE) monitoring kit 1 each by Does not apply route 2 (two) times daily.  1 each  3  . KOMBIGLYZE XR 12-998 MG TB24 TAKE 1 TABLET BY MOUTH DAILY.  90 tablet  3  . Lancets (FREESTYLE) lancets 1 each by Other route 2 (two) times daily. Use as instructed  100 each  11  . levocetirizine (XYZAL) 5 MG tablet Take 1 tablet (5 mg total) by mouth every evening.  30 tablet  0  . losartan-hydrochlorothiazide (HYZAAR) 100-12.5 MG per tablet TAKE 1 TABLET BY MOUTH DAILY.  90 tablet  3  . LOVAZA 1 G capsule TAKE 2 CAPSULES BY MOUTH TWICE A DAY  120 capsule  11  .  naproxen (NAPROSYN) 375 MG tablet Take 375 mg by mouth 2 (two) times daily with a meal.      . pravastatin (PRAVACHOL) 80 MG tablet TAKE 1 TABLET BY MOUTH EVERY DAY  90 tablet  3  . Travoprost, BAK Free, (TRAVATAN) 0.004 % SOLN ophthalmic solution Place 1 drop into the right eye at bedtime.      Lilian Kapur 3.75 G PACK TAKE 1 PACKET DAILY AS DIRECTED  90 packet  3   No current facility-administered medications on file prior to visit.   Review of Systems  Constitutional: Negative for unexpected weight change, or unusual diaphoresis  HENT: Negative for tinnitus.   Eyes: Negative for photophobia and visual disturbance.  Respiratory: Negative for choking and stridor.   Gastrointestinal: Negative for vomiting and blood in stool.  Genitourinary: Negative for hematuria  and decreased urine volume.  Musculoskeletal: Negative for acute joint swelling Skin: Negative for color change and wound.  Neurological: Negative for tremors and numbness other than noted  Psychiatric/Behavioral: Negative for decreased concentration or  hyperactivity.       Objective:   Physical Exam  VS noted,  Constitutional: Pt appears well-developed and well-nourished.  HENT: Head: NCAT.  Right Ear: External ear normal.  Left Ear: External ear normal.  Eyes: Conjunctivae and EOM are normal. Pupils are equal, round, and reactive to light.  Neck: Normal range of motion. Neck supple.  Cardiovascular: Normal rate and regular rhythm.   Pulmonary/Chest: Effort normal and breath sounds normal.  Abd:  Soft, NT, non-distended, + BS Neurological: Pt is alert. Not confused  Skin: with vague erythema to extremities above and distal to the left elbow with swelling, also upper right arm, and just distal to the right patella prox ant leg Psychiatric: Pt behavior is normal. Thought content normal.     Assessment & Plan:

## 2013-07-28 ENCOUNTER — Telehealth: Payer: Self-pay | Admitting: *Deleted

## 2013-07-28 NOTE — Telephone Encounter (Signed)
Karena Addison called states pt has had a rash and itching at steroid injection site.  Unable to take Benadryl due to history of Glaucoma.  Please advise

## 2013-07-28 NOTE — Telephone Encounter (Signed)
Put cortaid cream on it

## 2013-07-29 NOTE — Telephone Encounter (Signed)
Spoke with Karena Addison advised of MDs message

## 2013-07-30 ENCOUNTER — Other Ambulatory Visit: Payer: Self-pay | Admitting: Internal Medicine

## 2013-08-28 ENCOUNTER — Encounter: Payer: Self-pay | Admitting: Internal Medicine

## 2013-08-28 ENCOUNTER — Ambulatory Visit (INDEPENDENT_AMBULATORY_CARE_PROVIDER_SITE_OTHER): Payer: Managed Care, Other (non HMO) | Admitting: Internal Medicine

## 2013-08-28 VITALS — BP 138/66 | HR 70 | Temp 97.8°F | Resp 16 | Ht 62.0 in | Wt 149.0 lb

## 2013-08-28 DIAGNOSIS — I10 Essential (primary) hypertension: Secondary | ICD-10-CM

## 2013-08-28 DIAGNOSIS — IMO0001 Reserved for inherently not codable concepts without codable children: Secondary | ICD-10-CM

## 2013-08-28 DIAGNOSIS — E7800 Pure hypercholesterolemia, unspecified: Secondary | ICD-10-CM

## 2013-08-28 DIAGNOSIS — E781 Pure hyperglyceridemia: Secondary | ICD-10-CM

## 2013-08-28 DIAGNOSIS — E1165 Type 2 diabetes mellitus with hyperglycemia: Principal | ICD-10-CM

## 2013-08-28 DIAGNOSIS — J309 Allergic rhinitis, unspecified: Secondary | ICD-10-CM

## 2013-08-28 MED ORDER — LOSARTAN POTASSIUM-HCTZ 100-12.5 MG PO TABS: 1.0000 | ORAL_TABLET | Freq: Every day | ORAL | Status: DC

## 2013-08-28 MED ORDER — OMEGA-3-ACID ETHYL ESTERS 1 G PO CAPS: 2.0000 g | ORAL_CAPSULE | Freq: Two times a day (BID) | ORAL | Status: DC

## 2013-08-28 MED ORDER — SAXAGLIPTIN-METFORMIN ER 5-1000 MG PO TB24: 1.0000 | ORAL_TABLET | Freq: Every day | ORAL | Status: DC

## 2013-08-28 MED ORDER — LEVOCETIRIZINE DIHYDROCHLORIDE 5 MG PO TABS: 5.0000 mg | ORAL_TABLET | Freq: Every evening | ORAL | Status: DC

## 2013-08-28 MED ORDER — COLESEVELAM HCL 3.75 G PO PACK: 1.0000 | PACK | Freq: Every day | ORAL | Status: DC

## 2013-08-28 MED ORDER — PRAVASTATIN SODIUM 80 MG PO TABS: 80.0000 mg | ORAL_TABLET | Freq: Every day | ORAL | Status: AC

## 2013-08-28 MED ORDER — BAYER CONTOUR NEXT EZ W/DEVICE KIT: 1.0000 | PACK | Freq: Every day | Status: DC

## 2013-08-28 MED ORDER — GLUCOSE BLOOD VI STRP: ORAL_STRIP | Status: DC

## 2013-08-28 NOTE — Assessment & Plan Note (Signed)
His BP is well controlled 

## 2013-08-28 NOTE — Progress Notes (Signed)
   Subjective:    Patient ID: Adrian [name], male    DOB: Aug 16, 1940, 73 y.o.   MRN: [id number]  Hypertension This is a chronic problem. The current episode started more than 1 year ago. The problem is unchanged. The problem is controlled. Pertinent negatives include no anxiety, blurred vision, chest pain, headaches, malaise/fatigue, neck pain, orthopnea, palpitations, peripheral edema, PND, shortness of breath or sweats. Agents associated with hypertension include NSAIDs. Past treatments include angiotensin blockers and diuretics. Compliance problems include diet and exercise.       Review of Systems  Constitutional: Negative.  Negative for fever, chills, malaise/fatigue, diaphoresis, appetite change and fatigue.  HENT: Negative.   Eyes: Negative.  Negative for blurred vision.  Respiratory: Negative.  Negative for cough, choking, chest tightness, shortness of breath, wheezing and stridor.   Cardiovascular: Negative.  Negative for chest pain, palpitations, orthopnea, leg swelling and PND.  Gastrointestinal: Negative.  Negative for nausea, vomiting, diarrhea, constipation, blood in stool and abdominal distention.  Endocrine: Negative.   Genitourinary: Negative.   Musculoskeletal: Negative.  Negative for neck pain.  Skin: Negative.   Allergic/Immunologic: Negative.   Neurological: Negative.  Negative for dizziness, tremors, weakness, light-headedness, numbness and headaches.  Hematological: Negative.  Negative for adenopathy.  Psychiatric/Behavioral: Negative.        Objective:   Physical Exam  Vitals reviewed. Constitutional: He is oriented to person, place, and time. He appears well-developed and well-nourished. No distress.  HENT:  Head: Normocephalic and atraumatic.  Mouth/Throat: Oropharynx is clear and moist. No oropharyngeal exudate.  Eyes: Conjunctivae are normal. Right eye exhibits no discharge. Left eye exhibits no discharge. No scleral icterus.  Neck: Normal range of  motion. Neck supple. No JVD present. No tracheal deviation present. No thyromegaly present.  Cardiovascular: Normal rate, regular rhythm, normal heart sounds and intact distal pulses.  Exam reveals no gallop and no friction rub.   No murmur heard. Pulmonary/Chest: Effort normal and breath sounds normal. No stridor. No respiratory distress. He has no wheezes. He has no rales. He exhibits no tenderness.  Abdominal: Soft. Bowel sounds are normal. He exhibits no distension and no mass. There is no tenderness. There is no rebound and no guarding.  Musculoskeletal: Normal range of motion. He exhibits no edema and no tenderness.  Lymphadenopathy:    He has no cervical adenopathy.  Neurological: He is oriented to person, place, and time.  Skin: Skin is warm and dry. No rash noted. He is not diaphoretic. No erythema. No pallor.  Psychiatric: He has a normal mood and affect. His behavior is normal. Judgment and thought content normal.          Assessment & Plan:

## 2013-08-28 NOTE — Patient Instructions (Signed)

## 2013-08-28 NOTE — Assessment & Plan Note (Signed)
Recent A1C shows that blood sugars are well controlled

## 2013-08-29 ENCOUNTER — Telehealth: Payer: Self-pay

## 2013-08-29 NOTE — Telephone Encounter (Signed)
Relevant patient education mailed to patient.  

## 2013-10-10 ENCOUNTER — Telehealth: Payer: Self-pay

## 2013-10-10 DIAGNOSIS — E1165 Type 2 diabetes mellitus with hyperglycemia: Principal | ICD-10-CM

## 2013-10-10 DIAGNOSIS — IMO0001 Reserved for inherently not codable concepts without codable children: Secondary | ICD-10-CM

## 2013-10-10 NOTE — Telephone Encounter (Signed)
Received fax from [facility] stating that [place] is not covered. Please consider jentadueto or covered alternative. Thanks

## 2013-10-12 MED ORDER — LINAGLIPTIN-METFORMIN HCL 2.5-500 MG PO TABS: 1.0000 | ORAL_TABLET | Freq: Two times a day (BID) | ORAL | Status: DC

## 2013-10-12 NOTE — Telephone Encounter (Signed)
done

## 2013-11-02 ENCOUNTER — Other Ambulatory Visit: Payer: Self-pay | Admitting: Internal Medicine

## 2013-11-22 ENCOUNTER — Other Ambulatory Visit: Payer: Self-pay | Admitting: Internal Medicine

## 2013-12-09 ENCOUNTER — Other Ambulatory Visit: Payer: Self-pay | Admitting: Internal Medicine

## 2013-12-18 ENCOUNTER — Encounter: Payer: Self-pay | Admitting: Internal Medicine

## 2013-12-18 ENCOUNTER — Ambulatory Visit (INDEPENDENT_AMBULATORY_CARE_PROVIDER_SITE_OTHER): Payer: Managed Care, Other (non HMO) | Admitting: Internal Medicine

## 2013-12-18 ENCOUNTER — Other Ambulatory Visit (INDEPENDENT_AMBULATORY_CARE_PROVIDER_SITE_OTHER): Payer: Managed Care, Other (non HMO)

## 2013-12-18 ENCOUNTER — Ambulatory Visit (INDEPENDENT_AMBULATORY_CARE_PROVIDER_SITE_OTHER)
Admission: RE | Admit: 2013-12-18 | Discharge: 2013-12-18 | Disposition: A | Discharge status: Home / Self Care | Payer: Managed Care, Other (non HMO) | Source: Ambulatory Visit | Attending: Internal Medicine | Admitting: Internal Medicine

## 2013-12-18 VITALS — BP 150/80 | HR 60 | Temp 98.1°F | Resp 16 | Ht 62.0 in | Wt 154.0 lb

## 2013-12-18 DIAGNOSIS — E1165 Type 2 diabetes mellitus with hyperglycemia: Principal | ICD-10-CM

## 2013-12-18 DIAGNOSIS — I10 Essential (primary) hypertension: Secondary | ICD-10-CM

## 2013-12-18 DIAGNOSIS — M1990 Unspecified osteoarthritis, unspecified site: Secondary | ICD-10-CM

## 2013-12-18 DIAGNOSIS — R519 Headache, unspecified: Secondary | ICD-10-CM | POA: Insufficient documentation

## 2013-12-18 DIAGNOSIS — M25551 Pain in right hip: Secondary | ICD-10-CM

## 2013-12-18 DIAGNOSIS — M545 Low back pain, unspecified: Secondary | ICD-10-CM

## 2013-12-18 DIAGNOSIS — M25559 Pain in unspecified hip: Secondary | ICD-10-CM

## 2013-12-18 DIAGNOSIS — IMO0001 Reserved for inherently not codable concepts without codable children: Secondary | ICD-10-CM

## 2013-12-18 DIAGNOSIS — R51 Headache: Secondary | ICD-10-CM

## 2013-12-18 LAB — BASIC METABOLIC PANEL
BUN: 15 mg/dL (ref 6–23)
CHLORIDE: 104 meq/L (ref 96–112)
CO2: 32 mEq/L (ref 19–32)
Calcium: 9.6 mg/dL (ref 8.4–10.5)
Creatinine, Ser: 0.8 mg/dL (ref 0.4–1.5)
GFR: 104.71 mL/min (ref >60.00)
GLUCOSE: 116 mg/dL — AB (ref 70–99)
Potassium: 4 mEq/L (ref 3.5–5.1)
SODIUM: 140 meq/L (ref 135–145)

## 2013-12-18 LAB — HEMOGLOBIN A1C: Hgb A1c MFr Bld: 6.1 % (ref 4.6–6.5)

## 2013-12-18 MED ORDER — TRAMADOL HCL 50 MG PO TABS: 50.0000 mg | ORAL_TABLET | Freq: Three times a day (TID) | ORAL | Status: DC | PRN

## 2013-12-18 NOTE — Assessment & Plan Note (Signed)
The hip exam and xray are normal  I think this pain is referred from the L spine area

## 2013-12-18 NOTE — Progress Notes (Signed)
Pre visit review using our clinic review tool, if applicable. No additional management support is needed unless otherwise documented below in the visit note. 

## 2013-12-18 NOTE — Assessment & Plan Note (Signed)
Will try tramadol for the pain

## 2013-12-18 NOTE — Patient Instructions (Signed)
Migraine Headache A migraine headache is an intense, throbbing pain on one or both sides of your head. A migraine can last for 30 minutes to several hours. CAUSES  The exact cause of a migraine headache is not always known. However, a migraine may be caused when nerves in the brain become irritated and release chemicals that cause inflammation. This causes pain. Certain things may also trigger migraines, such as:  Alcohol.  Smoking.  Stress.  Menstruation.  Aged cheeses.  Foods or drinks that contain nitrates, glutamate, aspartame, or tyramine.  Lack of sleep.  Chocolate.  Caffeine.  Hunger.  Physical exertion.  Fatigue.  Medicines used to treat chest pain (nitroglycerine), birth control pills, estrogen, and some blood pressure medicines. SIGNS AND SYMPTOMS  Pain on one or both sides of your head.  Pulsating or throbbing pain.  Severe pain that prevents daily activities.  Pain that is aggravated by any physical activity.  Nausea, vomiting, or both.  Dizziness.  Pain with exposure to bright lights, loud noises, or activity.  General sensitivity to bright lights, loud noises, or smells. Before you get a migraine, you may get warning signs that a migraine is coming (aura). An aura may include:  Seeing flashing lights.  Seeing bright spots, halos, or zig-zag lines.  Having tunnel vision or blurred vision.  Having feelings of numbness or tingling.  Having trouble talking.  Having muscle weakness. DIAGNOSIS  A migraine headache is often diagnosed based on:  Symptoms.  Physical exam.  A CT scan or MRI of your head. These imaging tests cannot diagnose migraines, but they can help rule out other causes of headaches. TREATMENT Medicines may be given for pain and nausea. Medicines can also be given to help prevent recurrent migraines.  HOME CARE INSTRUCTIONS  Only take over-the-counter or prescription medicines for pain or discomfort as directed by your  health care provider. The use of long-term narcotics is not recommended.  Lie down in a dark, quiet room when you have a migraine.  Keep a journal to find out what may trigger your migraine headaches. For example, write down:  What you eat and drink.  How much sleep you get.  Any change to your diet or medicines.  Limit alcohol consumption.  Quit smoking if you smoke.  Get 7 9 hours of sleep, or as recommended by your health care provider.  Limit stress.  Keep lights dim if bright lights bother you and make your migraines worse. SEEK IMMEDIATE MEDICAL CARE IF:   Your migraine becomes severe.  You have a fever.  You have a stiff neck.  You have vision loss.  You have muscular weakness or loss of muscle control.  You start losing your balance or have trouble walking.  You feel faint or pass out.  You have severe symptoms that are different from your first symptoms. MAKE SURE YOU:   Understand these instructions.  Will watch your condition.  Will get help right away if you are not doing well or get worse. Document Released: 07/24/2005 Document Revised: 05/14/2013 Document Reviewed: 03/31/2013 [employer] Patient Information 2014 [employer].  

## 2013-12-19 ENCOUNTER — Encounter: Payer: Self-pay | Admitting: Internal Medicine

## 2013-12-19 NOTE — Assessment & Plan Note (Signed)
He has a new onset headache and at this age the risk of a CNS mass is significant so I have ordered a CT scan to evaluate further

## 2013-12-19 NOTE — Progress Notes (Signed)
Subjective:    Patient ID: Adrian [name], male    DOB: [birth year], 73 y.o.   MRN: [id number]  Back Pain This is a recurrent problem. The current episode started more than 1 month ago. The problem occurs intermittently. The problem has been gradually worsening since onset. The pain is present in the lumbar spine and gluteal. The quality of the pain is described as aching. The pain radiates to the right thigh. The pain is at a severity of 4/10. The pain is moderate. The pain is worse during the day. The symptoms are aggravated by bending, position and twisting. Associated symptoms include headaches. Pertinent negatives include no abdominal pain, bladder incontinence, bowel incontinence, chest pain, dysuria, fever, leg pain, numbness, paresis, paresthesias, pelvic pain, perianal numbness, tingling, weakness or weight loss. He has tried NSAIDs for the symptoms. The treatment provided moderate relief.      Review of Systems  Constitutional: Negative.  Negative for fever, chills, weight loss, diaphoresis, appetite change and fatigue.  Eyes: Negative.   Respiratory: Negative.  Negative for apnea, cough, choking, chest tightness, shortness of breath, wheezing and stridor.   Cardiovascular: Negative.  Negative for chest pain, palpitations and leg swelling.  Gastrointestinal: Negative.  Negative for nausea, vomiting, abdominal pain, diarrhea, constipation, blood in stool and bowel incontinence.  Endocrine: Negative.   Genitourinary: Negative.  Negative for bladder incontinence, dysuria and pelvic pain.  Musculoskeletal: Positive for arthralgias (rt hip) and back pain. Negative for gait problem, joint swelling, myalgias, neck pain and neck stiffness.  Skin: Negative.   Allergic/Immunologic: Negative.   Neurological: Positive for headaches. Negative for dizziness, tingling, tremors, seizures, syncope, facial asymmetry, speech difficulty, weakness, light-headedness, numbness and paresthesias.       He  has had a persistent headache for one month  Hematological: Negative.  Negative for adenopathy. Does not bruise/bleed easily.  Psychiatric/Behavioral: Negative.        Objective:   Physical Exam  Vitals reviewed. Constitutional: He is oriented to person, place, and time. He appears well-developed and well-nourished. No distress.  HENT:  Head: Normocephalic and atraumatic.  Mouth/Throat: Oropharynx is clear and moist. No oropharyngeal exudate.  Eyes: Conjunctivae are normal. Right eye exhibits no discharge. Left eye exhibits no discharge. No scleral icterus.  Neck: Normal range of motion. Neck supple. No JVD present. No tracheal deviation present. No thyromegaly present.  Cardiovascular: Normal rate, regular rhythm, normal heart sounds and intact distal pulses.  Exam reveals no gallop and no friction rub.   No murmur heard. Pulmonary/Chest: Effort normal and breath sounds normal. No stridor. No respiratory distress. He has no wheezes. He has no rales. He exhibits no tenderness.  Abdominal: Soft. Bowel sounds are normal. He exhibits no distension and no mass. There is no tenderness. There is no rebound and no guarding.  Musculoskeletal: Normal range of motion. He exhibits no edema and no tenderness.       Right hip: Normal. He exhibits normal range of motion, normal strength, no tenderness, no bony tenderness, no swelling, no crepitus, no deformity and no laceration.       Lumbar back: Normal. He exhibits normal range of motion, no tenderness, no bony tenderness, no swelling, no edema, no deformity, no laceration, no pain, no spasm and normal pulse.  Lymphadenopathy:    He has no cervical adenopathy.  Neurological: He is alert and oriented to person, place, and time. He has normal reflexes. He displays normal reflexes. No cranial nerve deficit. He exhibits normal muscle tone. Coordination normal.  Skin: Skin is warm and dry. No rash noted. He is not diaphoretic. No erythema. No pallor.    Psychiatric: He has a normal mood and affect. His behavior is normal. Judgment and thought content normal.          Assessment & Plan:

## 2013-12-19 NOTE — Assessment & Plan Note (Signed)
His blood sugar is well controlled 

## 2013-12-22 ENCOUNTER — Ambulatory Visit (INDEPENDENT_AMBULATORY_CARE_PROVIDER_SITE_OTHER)
Admission: RE | Admit: 2013-12-22 | Discharge: 2013-12-22 | Disposition: A | Discharge status: Home / Self Care | Payer: Managed Care, Other (non HMO) | Source: Ambulatory Visit | Attending: Internal Medicine | Admitting: Internal Medicine

## 2013-12-22 DIAGNOSIS — R51 Headache: Secondary | ICD-10-CM

## 2014-02-04 ENCOUNTER — Ambulatory Visit (INDEPENDENT_AMBULATORY_CARE_PROVIDER_SITE_OTHER): Payer: Medicare Other | Admitting: Internal Medicine

## 2014-02-04 ENCOUNTER — Encounter: Payer: Self-pay | Admitting: Internal Medicine

## 2014-02-04 VITALS — BP 142/78 | HR 57 | Temp 98.0°F | Wt 158.4 lb

## 2014-02-04 DIAGNOSIS — E1165 Type 2 diabetes mellitus with hyperglycemia: Secondary | ICD-10-CM

## 2014-02-04 DIAGNOSIS — I10 Essential (primary) hypertension: Secondary | ICD-10-CM

## 2014-02-04 DIAGNOSIS — IMO0001 Reserved for inherently not codable concepts without codable children: Secondary | ICD-10-CM

## 2014-02-04 DIAGNOSIS — E119 Type 2 diabetes mellitus without complications: Secondary | ICD-10-CM

## 2014-02-04 DIAGNOSIS — E7800 Pure hypercholesterolemia, unspecified: Secondary | ICD-10-CM

## 2014-02-04 MED ORDER — LINAGLIPTIN-METFORMIN HCL 2.5-500 MG PO TABS: ORAL_TABLET | ORAL | Status: DC

## 2014-02-04 NOTE — Progress Notes (Signed)
Subjective:    Patient ID: Adrian [name], male    DOB: 01-27-1941, 73 y.o.   MRN: [id number]  HPI  At home systolic blood pressures have been running 149-162. He has been compliant with his blood pressure medicines without adverse effects  His highest fasting blood sugar has been 125. He is not monitoring postprandial glucoses. He is compliant with his diabetic medicines.  He states he is not on an exercise or dietary program  He does have glaucoma & is followed by Dr. Herbert Deaner. He sees his podiatrist regularly.  He denies any hypoglycemia  His last A1c was 12/18/13 and was 6.1%. Renal function was normal.  He continues on high-dose statin; he has not had lipids checked since 01/30/12. At that time LDL was 75; HDL 67; and triglycerides 84.. Function tests are normal 06/18/13.   Review of Systems  He denies any significant headaches; chest pain; palpitations; claudication; paroxysmal nocturnal dyspnea; exertional dyspnea; lightheadedness or presyncope; edema; excessive thirst; polyphagia; or polyuria.  He has no numbness, tingling or weaknesses of extremities  There no nonhealing skin lesions  He has no blurred vision, double vision, loss of vision  He has had chronic abdominal pain for which she had extensive gastrointestinal evaluations. He is been treated for "gas".       Objective:   Physical Exam Gen.: Healthy and well-nourished in appearance. Alert, appropriate and cooperative throughout exam. Appears younger than stated age  Head: Normocephalic without obvious abnormalities;  some alopecia  Eyes: No corneal or conjunctival inflammation noted. Pupils equal round reactive to light and accommodation. Extraocular motion intact.  Ears: External  ear exam reveals no significant lesions or deformities. Canals clear .TMs normal. Hearing is grossly normal bilaterally. Nose: External nasal exam reveals no deformity or inflammation. Nasal mucosa are pink and moist. No lesions or  exudates noted.   Mouth: Oral mucosa and oropharynx reveal no lesions or exudates. Teeth in good repair. Neck: No deformities, masses, or tenderness noted. Range of motion & Thyroid normal. Lungs: Normal respiratory effort; chest expands symmetrically. Lungs are clear to auscultation without rales, wheezes, or increased work of breathing. Heart: Normal rate and rhythm. Normal S1 and S2. No gallop, click, or rub. No murmur. Abdomen: Bowel sounds normal; abdomen soft and nontender. No masses, organomegaly or hernias noted.                            Musculoskeletal/extremities: No deformity or scoliosis noted of  the thoracic or lumbar spine.  No clubbing, cyanosis, edema, or significant extremity  deformity noted. Range of motion normal .Tone & strength normal. Hand joints normal Fingernail health good. Able to lie down & sit up w/o help. Negative SLR bilaterally Vascular: Carotid, radial artery, dorsalis pedis and  posterior tibial pulses are full and equal. No bruits present. Neurologic: Alert and oriented x3. Deep tendon reflexes symmetrical and normal.  Gait normal.      Skin: Intact without suspicious lesions or rashes. Lymph: No cervical, axillary lymphadenopathy present. Psych: Mood and affect are normal. Normally interactive  Assessment & Plan:  #1 diabetes; A1c is down the nondiabetic range. His dual DM agent will be decreased to once a day with followup A1c and urine microalbumin in 3 months  #2 dyslipidemia; he's been asked to schedule fasting lipids and liver function tests this month  #3 hypertension; his cuff readings may be erroneous. I've asked him to check it against the blood pressure cuff at the pharmacy and bring it to all appointments.

## 2014-02-04 NOTE — Patient Instructions (Signed)
Minimal Blood Pressure Goal= AVERAGE < 140/90;  Ideal is an AVERAGE < 135/85. This AVERAGE should be calculated from @ least 5-7 BP readings taken @ different times of day on different days of week. You should not respond to isolated BP readings , but rather the AVERAGE for that week .Please bring your  blood pressure cuff to office visits to verify that it is reliable.It  can also be checked against the blood pressure device at the pharmacy. Finger or wrist cuffs are not dependable; an arm cuff is. A1c assesses average 24 hour  glucose over prior 6-12 weeks.  No Diabetes risk if < 6.1%  "Pre Diabetes" :6.2-6.4 % Good diabetic control: 6.5-7 % Fair diabetic control: 7-8 % Poor diabetic control: greater than 8 % ( except with additional factors such as  advanced age; significant coronary or neurologic disease,etc).  An  A1c of 8 % or less  is the safest goal for you.Most importantly, it is critical to prevent hypoglycemia.  Recheck A1c in 3 months after decreasing mediaction. Goals for home glucose monitoring are : fasting  or morning glucose goal of  100-150. Two hours after any meal , goal = < 180, preferably < 160. Report any low blood glucoses immediately.

## 2014-02-04 NOTE — Progress Notes (Signed)
Pre visit review using our clinic review tool, if applicable. No additional management support is needed unless otherwise documented below in the visit note. 

## 2014-02-05 ENCOUNTER — Telehealth: Payer: Self-pay | Admitting: Internal Medicine

## 2014-02-05 NOTE — Telephone Encounter (Signed)
Relevant patient education mailed to patient.  

## 2014-03-21 ENCOUNTER — Other Ambulatory Visit: Payer: Self-pay | Admitting: Internal Medicine

## 2014-06-09 ENCOUNTER — Other Ambulatory Visit: Payer: Self-pay | Admitting: Internal Medicine

## 2014-10-19 ENCOUNTER — Other Ambulatory Visit: Payer: Self-pay | Admitting: Family Medicine

## 2014-10-19 ENCOUNTER — Ambulatory Visit
Admission: RE | Admit: 2014-10-19 | Discharge: 2014-10-19 | Disposition: A | Discharge status: Home / Self Care | Payer: Medicare Other | Source: Ambulatory Visit | Attending: Family Medicine | Admitting: Family Medicine

## 2014-10-19 DIAGNOSIS — J069 Acute upper respiratory infection, unspecified: Secondary | ICD-10-CM

## 2014-10-21 ENCOUNTER — Other Ambulatory Visit: Payer: Self-pay | Admitting: Gastroenterology

## 2014-10-21 DIAGNOSIS — R1084 Generalized abdominal pain: Secondary | ICD-10-CM

## 2014-12-30 ENCOUNTER — Ambulatory Visit
Admission: RE | Admit: 2014-12-30 | Discharge: 2014-12-30 | Disposition: A | Discharge status: Home / Self Care | Payer: Medicare Other | Source: Ambulatory Visit | Attending: Gastroenterology | Admitting: Gastroenterology

## 2014-12-30 DIAGNOSIS — R1084 Generalized abdominal pain: Secondary | ICD-10-CM

## 2014-12-30 MED ORDER — IOHEXOL 300 MG/ML  SOLN
100.0000 mL | Freq: Once | INTRAMUSCULAR | Status: AC | PRN
  Administered 2014-12-30: 100 mL via INTRAVENOUS

## 2015-06-30 ENCOUNTER — Encounter: Payer: Self-pay | Admitting: Gastroenterology

## 2016-06-08 ENCOUNTER — Other Ambulatory Visit: Payer: Self-pay | Admitting: Family Medicine

## 2018-05-06 ENCOUNTER — Encounter: Payer: Self-pay | Admitting: Cardiology

## 2018-05-06 ENCOUNTER — Ambulatory Visit: Payer: Medicare Other | Admitting: Cardiology

## 2018-05-06 VITALS — BP 114/60 | HR 54 | Ht 62.0 in | Wt 154.0 lb

## 2018-05-06 DIAGNOSIS — R001 Bradycardia, unspecified: Secondary | ICD-10-CM | POA: Diagnosis not present

## 2018-05-06 DIAGNOSIS — R0789 Other chest pain: Secondary | ICD-10-CM

## 2018-05-06 DIAGNOSIS — I10 Essential (primary) hypertension: Secondary | ICD-10-CM | POA: Diagnosis not present

## 2018-05-06 NOTE — Progress Notes (Signed)
Cardiology Office Note    Date:  05/06/2018   ID:  Adrian [name], DOB Jan 18, 1941, MRN [id number]  PCP:  Antony Contras, MD  Cardiologist:  Fransico Him, MD   Chief Complaint  Patient presents with  . New Patient (Initial Visit)    Bradycardia    History of Present Illness:  Adrian [name] is a 77 y.o. male who is being seen today for the evaluation of hypertension at the request of Antony Contras, MD.  This is a very pleasant 77 year old male with a history of diabetes mellitus, hypertension, hyperlipidemia, GERD who is followed by his PCP.  He has been on valsartan HCT and amlodipine for blood pressure which has been well controlled.  He was last seen in his PCPs office on 04/24/2018 he was noted to have a heart rate of 48 bpm.  He complained to his PCP that he occasionally feels lightheaded and dizzy and wanted to have an evaluation by cardiology and is therefore referred today.  He denies any chest pain or pressure, SOB, DOE, PND, orthopnea, LE edema, dizziness, palpitations or syncope. He is compliant with his meds and is tolerating meds with no SE.     Past Medical History:  Diagnosis Date  . 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   Marland Kitchen BPH (benign prostatic hyperplasia) 05/29/2013  . Bronchitis, acute 11/10/2010  . 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 Conjunctivitis, acute 04/14/2011  . Cough 11/01/2010  . DIABETES MELLITUS   . Diarrhea 04/14/2011  . DJD (degenerative joint disease) 01/08/2012  . ERECTILE DYSFUNCTION   . ESSENTIAL HYPERTENSION   . Finger pain 05/29/2013  . GASTRITIS   . Helicobacter pylori (H. pylori)   . Hernia, inguinal, left 04/10/2012  . HYPERCHOLESTEROLEMIA   . Hypertriglyceridemia 12/07/2010  . Pain of right thigh 06/08/2011  . Parotid mass 12/13/2012  . Personal history of colonic polyps   . Plantar fasciitis, right 01/08/2012  . Rash and nonspecific skin eruption 07/17/2013  . RENAL CYST   . Sciatica of  right side 06/08/2011  . Sialadenitis 12/13/2012  . Tinea cruris 04/10/2012    Past Surgical History:  Procedure Laterality Date  . CATARACT EXTRACTION    . EYE SURGERY     (R) glaucoma (to relieve pressure)  . HERNIA REPAIR     right inguinal  . INGUINAL HERNIA REPAIR  04/29/2012   Procedure: HERNIA REPAIR INGUINAL ADULT;  Surgeon: Imogene Burn. Georgette Dover, MD;  Location: [facility];  Service: General;  Laterality: Left;  Left inguinal hernia repair w/mesh  . Left rotator cuff repair    . right wrist surgery      Current Medications: Current Meds  Medication Sig  . AMLODIPINE BESYLATE PO Take 10 mg by mouth daily.  . Blood Glucose Monitoring Suppl (CONTOUR NEXT EZ MONITOR) W/DEVICE KIT 1 Act by Does not apply route daily.  . brimonidine-timolol (COMBIGAN) 0.2-0.5 % ophthalmic solution Place 1 drop into the right eye every 12 (twelve) hours.  . carboxymethylcellulose (REFRESH PLUS) 0.5 % SOLN Place 1 drop into both eyes 3 (three) times daily as needed. For dry eyes.  . Colesevelam HCl (WELCHOL) 3.75 G PACK Take 1 packet by mouth daily.  . [employer] Lancets Ultra Thin MISC TEST TWICE A DAY AS DIRECTED  . dorzolamide (TRUSOPT) 2 % ophthalmic solution Place 1 drop into the right eye 2 (two) times daily.  . fluocinonide-emollient (LIDEX-E) 0.05 % cream APPLY TO AFFECTED AREA TWICE  A DAY  . FREESTYLE LITE test strip USE TWICE A DAY  . glucose blood (BAYER CONTOUR NEXT TEST) test strip Use QD  . glucose monitoring kit (FREESTYLE) monitoring kit 1 each by Does not apply route 2 (two) times daily.  Marland Kitchen levocetirizine (XYZAL) 5 MG tablet Take 1 tablet (5 mg total) by mouth every evening.  . Linagliptin-Metformin HCl (JENTADUETO) 2.5-500 MG TABS 1 daily with largest meal  . losartan-hydrochlorothiazide (HYZAAR) 100-12.5 MG per tablet TAKE 1 TABLET BY MOUTH DAILY.  . metFORMIN (GLUCOPHAGE) 500 MG tablet Take 500 mg by mouth 2 (two) times daily with a meal.  . naproxen (NAPROSYN) 375 MG tablet Take 375 mg by mouth 2  (two) times daily with a meal.  . omega-3 acid ethyl esters (LOVAZA) 1 G capsule Take 2 capsules (2 g total) by mouth 2 (two) times daily.  . pravastatin (PRAVACHOL) 80 MG tablet Take 1 tablet (80 mg total) by mouth daily.  . traMADol (ULTRAM) 50 MG tablet Take 1 tablet (50 mg total) by mouth every 8 (eight) hours as needed.  . Travoprost, BAK Free, (TRAVATAN) 0.004 % SOLN ophthalmic solution Place 1 drop into the right eye at bedtime.    Allergies:   Lisinopril   Social History   Socioeconomic History  . Marital status: Divorced    Spouse name: Not on file  . Number of children: 7  . Years of education: Not on file  . Highest education level: Not on file  Occupational History  . Occupation: Retired  Scientific laboratory technician  . Financial resource strain: Not on file  . Food insecurity:    Worry: Not on file    Inability: Not on file  . Transportation needs:    Medical: Not on file    Non-medical: Not on file  Tobacco Use  . Smoking status: Former Research scientist (life sciences)  . Smokeless tobacco: Never Used  . Tobacco comment: Stopped 30 years ago  Substance and Sexual Activity  . Alcohol use: No  . Drug use: No  . Sexual activity: Yes  Lifestyle  . Physical activity:    Days per week: Not on file    Minutes per session: Not on file  . Stress: Not on file  Relationships  . Social connections:    Talks on phone: Not on file    Gets together: Not on file    Attends religious service: Not on file    Active member of club or organization: Not on file    Attends meetings of clubs or organizations: Not on file    Relationship status: Not on file  Other Topics Concern  . Not on file  Social History Narrative  . Not on file     Family History:  The patient's family history includes Diabetes in his brother and mother; Hearing loss in his daughter; Hypertension in his other.   ROS:   Please see the history of present illness.    ROS All other systems reviewed and are negative.  No flowsheet data  found.     PHYSICAL EXAM:   VS:  BP 114/60   Pulse (!) 54   Ht _0  (1.575 m)   Wt 154 lb (69.9 kg)   SpO2 94%   BMI 28.17 kg/m    GEN: Well nourished, well developed, in no acute distress  HEENT: normal  Neck: no JVD, carotid bruits, or masses Cardiac: RRR; no murmurs, rubs, or gallops,no edema.  Intact distal pulses bilaterally.  Respiratory:  clear  to auscultation bilaterally, normal work of breathing GI: soft, nontender, nondistended, + BS MS: no deformity or atrophy  Skin: warm and dry, no rash Neuro:  Alert and Oriented x 3, Strength and sensation are intact Psych: euthymic mood, full affect  Wt Readings from Last 3 Encounters:  05/06/18 154 lb (69.9 kg)  02/04/14 158 lb 6.4 oz (71.8 kg)  12/18/13 154 lb (69.9 kg)      Studies/Labs Reviewed:   EKG:  EKG is ordered today.  The ekg ordered today demonstrates sinus bradycardia at 54 bpm with no ST changes.  Recent Labs: No results found for requested labs within last 8760 hours.   Lipid Panel    Component Value Date/Time   CHOL 160 01/30/2012 1139   TRIG 84.0 01/30/2012 1139   HDL 67.80 01/30/2012 1139   CHOLHDL 2 01/30/2012 1139   VLDL 16.8 01/30/2012 1139   LDLCALC 75 01/30/2012 1139   LDLDIRECT 116.6 11/10/2010 1213    Additional studies/ records that were reviewed today include:      ASSESSMENT:    1. Bradycardia   2. Essential hypertension   3. Atypical chest pain      PLAN:  In order of problems listed above:  1.  Bradycardia -heart rate at PCP office was 48 bpm and typically runs anywhere from 47 to 75 bpm with most readings 50 to 60 bpm in the past 10 years per PCP documentation.  He is completely asymptomatic.  He denies any dizzy spells, presyncope or syncope.  He has no weakness or fatigue.  I reassured him that a heart rate in the 50s is normal and nothing would be done without symptoms.  I will get a 24-hour Holter monitor to just assess for average heart rate and also check a  TSH.  2.  HTN - BP is controlled on exam today.  He will continue on amlodipine 10 mg daily and valsartan-HCTZ 320-25 mg daily  3.  Atypical chest pain -he has occasional intermittent right-sided chest pain that is sharp and he says it feels like a muscle spasm that lasts a second at a time.  There is no exertional component.  I do not think this is cardiac in nature and no further work-up recommended at this time.    Medication Adjustments/Labs and Tests Ordered: Current medicines are reviewed at length with the patient today.  Concerns regarding medicines are outlined above.  Medication changes, Labs and Tests ordered today are listed in the Patient Instructions below.  There are no Patient Instructions on file for this visit.   Signed, Fransico Him, MD  05/06/2018 2:20 PM    [facility] [address], [place], [place]  [address] Phone: [phone]; Fax: [phone]

## 2018-05-06 NOTE — Patient Instructions (Signed)
Medication Instructions:  Your physician recommends that you continue on your current medications as directed. Please refer to the Current Medication list given to you today.  Labwork: Today: TSH  Testing/Procedures: Your physician has recommended that you wear a 24 Hour holter monitor. Holter monitors are medical devices that record the heart's electrical activity. Doctors most often use these monitors to diagnose arrhythmias. Arrhythmias are problems with the speed or rhythm of the heartbeat. The monitor is a small, portable device. You can wear one while you do your normal daily activities. This is usually used to diagnose what is causing palpitations/syncope (passing out).   Follow-Up: As needed.  If you need a refill on your cardiac medications before your next appointment, please call your pharmacy.

## 2018-05-07 LAB — TSH: TSH: 2.59 u[IU]/mL (ref 0.450–4.500)

## 2018-05-09 ENCOUNTER — Ambulatory Visit (INDEPENDENT_AMBULATORY_CARE_PROVIDER_SITE_OTHER): Payer: Medicare Other

## 2018-05-09 DIAGNOSIS — R001 Bradycardia, unspecified: Secondary | ICD-10-CM

## 2018-06-25 ENCOUNTER — Telehealth: Payer: Self-pay | Admitting: Cardiology

## 2018-06-25 DIAGNOSIS — I493 Ventricular premature depolarization: Secondary | ICD-10-CM

## 2018-06-25 NOTE — Telephone Encounter (Signed)
New Message          Patient's friend is calling today to schedule a Echo and to get lab work done. I don't have an order for the labs or a referral for the Echo. Can you check with Dr. Radford Pax. Patient's friend states he got a letter stating he need this done.

## 2018-06-27 NOTE — Telephone Encounter (Signed)
Notes recorded by Sueanne Margarita, MD on 05/13/2018 at 2:35 PM EDT Please have patient come in for a be met and TSH for his PVCs. ------  Notes recorded by Sueanne Margarita, MD on 05/13/2018 at 2:35 PM EDT Please let patient know that heart monitor was fine with an average heartbeat of 57 bpm with occasional extra heartbeats from the top and bottom of the heart. Get a 2D echocardiogram to assess LV function given PVCs. Patient was asymptomatic at last office visit.

## 2018-06-27 NOTE — Telephone Encounter (Signed)
Spoke with the patient, he advised that I talk to his wife, she expressed understanding about her results and accepted doing the labs and echo.

## 2018-07-02 ENCOUNTER — Other Ambulatory Visit: Payer: Medicare Other

## 2018-07-02 ENCOUNTER — Other Ambulatory Visit (HOSPITAL_COMMUNITY): Payer: Medicare Other

## 2018-07-10 ENCOUNTER — Other Ambulatory Visit: Payer: Self-pay

## 2018-07-10 ENCOUNTER — Other Ambulatory Visit: Payer: Medicare Other | Admitting: *Deleted

## 2018-07-10 ENCOUNTER — Ambulatory Visit (HOSPITAL_COMMUNITY): Payer: Medicare Other | Attending: Cardiology

## 2018-07-10 DIAGNOSIS — I493 Ventricular premature depolarization: Secondary | ICD-10-CM

## 2018-07-10 MED ORDER — PERFLUTREN LIPID MICROSPHERE
1.0000 mL | INTRAVENOUS | Status: AC | PRN
  Administered 2018-07-10: 2 mL via INTRAVENOUS

## 2018-07-11 LAB — BASIC METABOLIC PANEL
BUN/Creatinine Ratio: 14 (ref 10–24)
BUN: 15 mg/dL (ref 8–27)
CALCIUM: 9.2 mg/dL (ref 8.6–10.2)
CO2: 24 mmol/L (ref 20–29)
Chloride: 103 mmol/L (ref 96–106)
Creatinine, Ser: 1.05 mg/dL (ref 0.76–1.27)
GFR calc Af Amer: 78 mL/min/{1.73_m2} (ref >59)
GFR, EST NON AFRICAN AMERICAN: 67 mL/min/{1.73_m2} (ref >59)
Glucose: 108 mg/dL — ABNORMAL HIGH (ref 65–99)
Potassium: 3.9 mmol/L (ref 3.5–5.2)
SODIUM: 142 mmol/L (ref 134–144)

## 2018-07-11 LAB — TSH: TSH: 3.19 u[IU]/mL (ref 0.450–4.500)

## 2019-02-26 ENCOUNTER — Other Ambulatory Visit: Payer: Self-pay | Admitting: Family Medicine

## 2019-02-26 DIAGNOSIS — R748 Abnormal levels of other serum enzymes: Secondary | ICD-10-CM

## 2019-02-26 DIAGNOSIS — R109 Unspecified abdominal pain: Secondary | ICD-10-CM

## 2019-03-07 ENCOUNTER — Inpatient Hospital Stay: Admission: RE | Admit: 2019-03-07 | Payer: Medicare Other | Source: Ambulatory Visit

## 2019-03-19 ENCOUNTER — Ambulatory Visit
Admission: RE | Admit: 2019-03-19 | Discharge: 2019-03-19 | Disposition: A | Discharge status: Home / Self Care | Payer: Medicare Other | Source: Ambulatory Visit | Attending: Family Medicine | Admitting: Family Medicine

## 2019-03-19 DIAGNOSIS — R109 Unspecified abdominal pain: Secondary | ICD-10-CM

## 2019-03-19 DIAGNOSIS — R748 Abnormal levels of other serum enzymes: Secondary | ICD-10-CM

## 2019-03-19 MED ORDER — IOPAMIDOL (ISOVUE-300) INJECTION 61%
100.0000 mL | Freq: Once | INTRAVENOUS | Status: AC | PRN
  Administered 2019-03-19: 100 mL via INTRAVENOUS

## 2019-05-20 ENCOUNTER — Other Ambulatory Visit: Payer: Self-pay | Admitting: Gastroenterology

## 2019-05-20 DIAGNOSIS — R109 Unspecified abdominal pain: Secondary | ICD-10-CM

## 2020-05-04 ENCOUNTER — Ambulatory Visit
Admission: RE | Admit: 2020-05-04 | Discharge: 2020-05-04 | Disposition: A | Discharge status: Home / Self Care | Payer: Medicare Other | Source: Ambulatory Visit | Attending: Family Medicine | Admitting: Family Medicine

## 2020-05-04 ENCOUNTER — Other Ambulatory Visit: Payer: Self-pay

## 2020-05-04 ENCOUNTER — Other Ambulatory Visit: Payer: Self-pay | Admitting: Family Medicine

## 2020-05-04 DIAGNOSIS — M79644 Pain in right finger(s): Secondary | ICD-10-CM

## 2020-07-09 ENCOUNTER — Ambulatory Visit: Payer: Medicare Other | Admitting: Medical

## 2021-05-05 ENCOUNTER — Other Ambulatory Visit: Payer: Self-pay | Admitting: Family Medicine

## 2021-05-12 ENCOUNTER — Other Ambulatory Visit: Payer: Self-pay | Admitting: Family Medicine

## 2021-05-12 DIAGNOSIS — I739 Peripheral vascular disease, unspecified: Secondary | ICD-10-CM

## 2021-06-13 ENCOUNTER — Ambulatory Visit
Admission: RE | Admit: 2021-06-13 | Discharge: 2021-06-13 | Disposition: A | Discharge status: Home / Self Care | Payer: Medicare Other | Source: Ambulatory Visit | Attending: Family Medicine | Admitting: Family Medicine

## 2021-06-13 DIAGNOSIS — I739 Peripheral vascular disease, unspecified: Secondary | ICD-10-CM

## 2022-08-14 DIAGNOSIS — L661 Lichen planopilaris: Secondary | ICD-10-CM | POA: Diagnosis not present

## 2022-08-20 ENCOUNTER — Encounter: Payer: Self-pay | Admitting: Emergency Medicine

## 2022-08-20 ENCOUNTER — Ambulatory Visit
Admission: EM | Admit: 2022-08-20 | Discharge: 2022-08-20 | Disposition: A | Discharge status: Home / Self Care | Payer: 59 | Attending: Physician Assistant | Admitting: Physician Assistant

## 2022-08-20 DIAGNOSIS — Z1152 Encounter for screening for COVID-19: Secondary | ICD-10-CM | POA: Insufficient documentation

## 2022-08-20 DIAGNOSIS — J069 Acute upper respiratory infection, unspecified: Secondary | ICD-10-CM | POA: Insufficient documentation

## 2022-08-20 NOTE — ED Triage Notes (Signed)
Patient c/o cough and sneezing x 2 days.  Patient has taken Allegra and Mucinex.

## 2022-08-21 ENCOUNTER — Encounter: Payer: Self-pay | Admitting: Physician Assistant

## 2022-08-21 LAB — SARS CORONAVIRUS 2 (TAT 6-24 HRS): SARS Coronavirus 2: NEGATIVE

## 2022-08-21 NOTE — ED Provider Notes (Signed)
[facility]    CSN: [id number] Arrival date & time: 08/20/22  1038      History   Chief Complaint Chief Complaint  Patient presents with   Cough    HPI Adrian [name] is a 82 y.o. male.   Patient here today for evaluation of cough and sneezing has had for the last 2 days.  He has taken Allegra and Mucinex without resolution.  He has not had fever.  He denies any vomiting or diarrhea.  His fiance is here with similar symptoms.  The history is provided by the patient.  Cough Associated symptoms: no chills, no ear pain, no eye discharge, no fever, no shortness of breath and no sore throat     Past Medical History:  Diagnosis Date   Benign neoplasm of colon 05/01/2008   Qualifier: Diagnosis of  By: Ronnald Ramp MD, Arvid Right.    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 pylori)    Hernia, inguinal, left 04/10/2012   HYPERCHOLESTEROLEMIA    Hypertriglyceridemia 12/07/2010   Pain of right thigh 06/08/2011   Parotid mass 12/13/2012   Personal history of colonic polyps    Plantar fasciitis, right 01/08/2012   Rash and nonspecific skin eruption 07/17/2013   RENAL CYST    Sciatica of right side 06/08/2011   Sialadenitis 12/13/2012   Tinea cruris 04/10/2012    Patient Active Problem List   Diagnosis Date Noted   Bradycardia 05/06/2018   Atypical chest pain 05/06/2018   Right hip pain 12/18/2013   Low back pain 12/18/2013   Headache(784.0) 12/18/2013   Chronic abdominal pain 07/17/2013   IBS (irritable bowel syndrome) 06/18/2013   Routine general medical examination at a health care facility 05/30/2013   BPH (benign prostatic hyperplasia) 05/29/2013    Eczema 08/28/2012   Tinea cruris 04/10/2012   DJD (degenerative joint disease) 01/08/2012   Hypertriglyceridemia 12/07/2010   ERECTILE DYSFUNCTION 05/18/2010   GASTRITIS 01/05/2010   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 Georgette Dover, MD;  Location: [place];  Service: General;  Laterality: Left;  Left inguinal hernia repair w/mesh   Left rotator cuff repair     right wrist surgery         Home Medications    Prior to Admission medications   Medication Sig Start Date End Date Taking? Authorizing Provider  AMLODIPINE BESYLATE PO Take 10 mg by mouth daily.   Yes [provider]  Blood Glucose Monitoring Suppl (CONTOUR NEXT EZ MONITOR) W/DEVICE KIT 1 Act by Does not apply route daily. 08/28/13  Yes Janith Lima, MD  brimonidine-timolol (COMBIGAN) 0.2-0.5 % ophthalmic solution Place 1 drop into the right eye every 12 (twelve) hours.   Yes [provider]  carboxymethylcellulose (REFRESH PLUS) 0.5 % SOLN Place 1 drop into both eyes 3 (three) times daily as needed. For dry eyes.   Yes [provider]  Colesevelam HCl [facility]) 3.75 G PACK Take 1 packet by mouth daily. 08/28/13  Yes Janith Lima, MD  [employer] Lancets Ultra Thin MISC TEST TWICE A DAY AS DIRECTED 11/02/13  Yes Janith Lima, MD  dorzolamide (TRUSOPT) 2 % ophthalmic solution Place 1 drop into the right eye 2 (two) times daily.   Yes [provider]  fluocinonide-emollient (LIDEX-E) 0.05 % cream APPLY TO AFFECTED AREA TWICE A DAY 06/09/14  Yes Janith Lima, MD  FREESTYLE LITE  test strip USE TWICE A DAY 11/02/13  Yes Janith Lima, MD  glucose blood (BAYER CONTOUR NEXT TEST) test strip Use QD 08/28/13  Yes Janith Lima, MD  glucose monitoring kit (FREESTYLE) monitoring kit 1 each by Does not apply route 2 (two) times daily. 10/02/12  Yes Janith Lima, MD  levocetirizine (XYZAL) 5 MG tablet Take 1 tablet (5 mg total) by mouth every evening. 08/28/13  Yes Janith Lima, MD  Linagliptin-Metformin HCl (JENTADUETO) 2.5-500 MG TABS 1 daily with largest meal 02/04/14  Yes Hendricks Limes, MD  losartan-hydrochlorothiazide (HYZAAR) 100-12.5 MG per tablet TAKE 1 TABLET BY MOUTH DAILY. 11/22/13  Yes Janith Lima, MD  metFORMIN (GLUCOPHAGE) 500 MG tablet Take 500 mg by mouth 2 (two) times daily with a meal.   Yes [provider]  naproxen (NAPROSYN) 375 MG tablet Take 375 mg by mouth 2 (two) times daily with a meal.   Yes [provider]  omega-3 acid ethyl esters (LOVAZA) 1 G capsule Take 2 capsules (2 g total) by mouth 2 (two) times daily. 08/28/13  Yes Janith Lima, MD  pravastatin (PRAVACHOL) 80 MG tablet Take 1 tablet (80 mg total) by mouth daily. 08/28/13  Yes Janith Lima, MD  traMADol (ULTRAM) 50 MG tablet Take 1 tablet (50 mg total) by mouth every 8 (eight) hours as needed. 12/18/13  Yes Janith Lima, MD  Travoprost, BAK Free, (TRAVATAN) 0.004 % SOLN ophthalmic solution Place 1 drop into the right eye at bedtime.   Yes [provider]    Family History Family History  Problem Relation Age of Onset   Diabetes Mother    Diabetes Brother    Hearing loss Daughter    Hypertension Other    Cancer Neg Hx    Alcohol abuse Neg Hx    Early death Neg Hx    Heart disease Neg Hx    Hyperlipidemia Neg Hx    Stroke Neg Hx     Social History Social History   Tobacco Use   Smoking status: Former   Smokeless tobacco: Never   Tobacco comments:    Stopped 30 years ago  Vaping Use   Vaping Use: Never used  Substance Use Topics    Alcohol use: No   Drug use: No     Allergies   Lisinopril   Review of Systems Review of Systems  Constitutional:  Negative for chills and fever.  HENT:  Positive for congestion and sneezing. Negative for ear pain and sore throat.   Eyes:  Negative for discharge and redness.  Respiratory:  Positive for cough. Negative for shortness of breath.   Gastrointestinal:  Negative for abdominal pain, nausea and vomiting.     Physical Exam Triage Vital Signs ED Triage Vitals  Enc Vitals Group  BP 08/20/22 1141 (!) 155/89     Pulse Rate 08/20/22 1141 66     Resp 08/20/22 1141 18     Temp 08/20/22 1141 98 F (36.7 C)     Temp Source 08/20/22 1141 Oral     SpO2 08/20/22 1141 95 %     Weight 08/20/22 1143 135 lb (61.2 kg)     Height 08/20/22 1143 '5\' 2"'$  (1.575 m)     Head Circumference --      Peak Flow --      Pain Score 08/20/22 1142 0     Pain Loc --      Pain Edu? --      Excl. in [place]? --    No data found.  Updated Vital Signs BP (!) 155/89 (BP Location: Right Arm)   Pulse 66   Temp 98 F (36.7 C) (Oral)   Resp 18   Ht '5\' 2"'$  (1.575 m)   Wt 135 lb (61.2 kg)   SpO2 95%   BMI 24.69 kg/m       Physical Exam Vitals and nursing note reviewed.  Constitutional:      General: He is not in acute distress.    Appearance: Normal appearance. He is not ill-appearing.  HENT:     Head: Normocephalic and atraumatic.     Nose: Congestion present.     Mouth/Throat:     Mouth: Mucous membranes are moist.     Pharynx: Oropharynx is clear. No oropharyngeal exudate or posterior oropharyngeal erythema.  Eyes:     Conjunctiva/sclera: Conjunctivae normal.  Cardiovascular:     Rate and Rhythm: Normal rate and regular rhythm.     Heart sounds: Normal heart sounds. No murmur heard. Pulmonary:     Effort: Pulmonary effort is normal. No respiratory distress.     Breath sounds: Normal breath sounds. No wheezing, rhonchi or rales.  Skin:    General: Skin is warm and dry.   Neurological:     Mental Status: He is alert.  Psychiatric:        Mood and Affect: Mood normal.        Thought Content: Thought content normal.      UC Treatments / Results  Labs (all labs ordered are listed, but only abnormal results are displayed) Labs Reviewed  SARS CORONAVIRUS 2 (TAT 6-24 HRS)    EKG   Radiology No results found.  Procedures Procedures (including critical care time)  Medications Ordered in UC Medications - No data to display  Initial Impression / Assessment and Plan / UC Course  I have reviewed the triage vital signs and the nursing notes.  Pertinent labs & imaging results that were available during my care of the patient were reviewed by me and considered in my medical decision making (see chart for details).    Suspect viral etiology of symptoms.  Will screen for COVID and recommended symptomatic treatment, increase fluids and rest.  Encouraged follow-up if no gradual improvement or with any further concerns.  Final Clinical Impressions(s) / UC Diagnoses   Final diagnoses:  Acute upper respiratory infection  Encounter for screening for COVID-19   Discharge Instructions   None    ED Prescriptions   None    PDMP not reviewed this encounter.   Francene Finders, PA-C 08/21/22 1309

## 2022-09-11 DIAGNOSIS — H401213 Low-tension glaucoma, right eye, severe stage: Secondary | ICD-10-CM | POA: Diagnosis not present

## 2022-09-11 DIAGNOSIS — H47011 Ischemic optic neuropathy, right eye: Secondary | ICD-10-CM | POA: Diagnosis not present

## 2022-09-11 DIAGNOSIS — H401221 Low-tension glaucoma, left eye, mild stage: Secondary | ICD-10-CM | POA: Diagnosis not present

## 2022-09-14 DIAGNOSIS — B349 Viral infection, unspecified: Secondary | ICD-10-CM | POA: Diagnosis not present

## 2022-09-14 DIAGNOSIS — Z03818 Encounter for observation for suspected exposure to other biological agents ruled out: Secondary | ICD-10-CM | POA: Diagnosis not present

## 2023-02-22 ENCOUNTER — Encounter: Payer: Self-pay | Admitting: Gastroenterology

## 2023-06-19 ENCOUNTER — Ambulatory Visit: Payer: 59 | Admitting: Gastroenterology

## 2023-07-30 ENCOUNTER — Ambulatory Visit: Payer: 59 | Admitting: Family Medicine

## 2023-08-23 ENCOUNTER — Ambulatory Visit (INDEPENDENT_AMBULATORY_CARE_PROVIDER_SITE_OTHER): Payer: 59 | Admitting: Family Medicine

## 2023-08-23 VITALS — BP 132/60 | HR 66 | Temp 97.3°F | Ht 61.61 in | Wt 136.8 lb

## 2023-08-23 DIAGNOSIS — H409 Unspecified glaucoma: Secondary | ICD-10-CM | POA: Diagnosis not present

## 2023-08-23 DIAGNOSIS — E1159 Type 2 diabetes mellitus with other circulatory complications: Secondary | ICD-10-CM

## 2023-08-23 DIAGNOSIS — E1139 Type 2 diabetes mellitus with other diabetic ophthalmic complication: Secondary | ICD-10-CM

## 2023-08-23 DIAGNOSIS — E782 Mixed hyperlipidemia: Secondary | ICD-10-CM

## 2023-08-23 DIAGNOSIS — I152 Hypertension secondary to endocrine disorders: Secondary | ICD-10-CM

## 2023-08-23 DIAGNOSIS — R109 Unspecified abdominal pain: Secondary | ICD-10-CM

## 2023-08-23 DIAGNOSIS — J310 Chronic rhinitis: Secondary | ICD-10-CM

## 2023-08-23 DIAGNOSIS — G8929 Other chronic pain: Secondary | ICD-10-CM

## 2023-08-23 DIAGNOSIS — Z7985 Long-term (current) use of injectable non-insulin antidiabetic drugs: Secondary | ICD-10-CM

## 2023-08-23 MED ORDER — LANCET DEVICE MISC: 1.0000 | Freq: Three times a day (TID) | 0 refills | Status: AC

## 2023-08-23 MED ORDER — VALSARTAN-HYDROCHLOROTHIAZIDE 320-25 MG PO TABS: 1.0000 | ORAL_TABLET | Freq: Every day | ORAL | 0 refills | Status: DC

## 2023-08-23 MED ORDER — ICOSAPENT ETHYL 1 G PO CAPS: 2.0000 g | ORAL_CAPSULE | Freq: Two times a day (BID) | ORAL | 0 refills | Status: DC

## 2023-08-23 MED ORDER — BLOOD GLUCOSE TEST VI STRP: 1.0000 | ORAL_STRIP | Freq: Three times a day (TID) | 0 refills | Status: DC

## 2023-08-23 MED ORDER — TRULICITY 0.75 MG/0.5ML ~~LOC~~ SOAJ: 0.7500 mg | SUBCUTANEOUS | 0 refills | Status: DC

## 2023-08-23 MED ORDER — LANCETS MISC. MISC: 1.0000 | Freq: Three times a day (TID) | 0 refills | Status: AC

## 2023-08-23 MED ORDER — SYNJARDY XR 10-1000 MG PO TB24: 1.0000 | ORAL_TABLET | Freq: Every day | ORAL | 0 refills | Status: DC

## 2023-08-23 MED ORDER — BLOOD GLUCOSE MONITORING SUPPL DEVI: 1.0000 | Freq: Three times a day (TID) | 0 refills | Status: DC

## 2023-08-23 NOTE — Patient Instructions (Signed)
It was a pleasure to see you today! Thank you for choosing [place] for your primary care. Adrian [name] was seen for establishing care and medication refills. We have refilled your prescriptions for blood pressure, cholesterol, and diabetes for 100 days as requested.

## 2023-08-23 NOTE — Progress Notes (Signed)
Assessment/Plan:   Problem List Items Addressed This Visit       Cardiovascular and Mediastinum   Hypertension associated with type 2 diabetes mellitus (HCC)   Stable, blood pressure within target range on valsartan hydrochlorothiazide Plan: Continue current antihypertensive medications. Refill prescriptions sent to [facility] pharmacy for a 100-day supply. Encourage continuation of daily blood pressure monitoring.      Relevant Medications   Empagliflozin-metFORMIN HCl ER (SYNJARDY XR) 05-999 MG TB24   icosapent Ethyl (VASCEPA) 1 g capsule   TRULICITY 0.75 MG/0.5ML SOAJ   valsartan-hydrochlorothiazide (DIOVAN-HCT) 320-25 MG tablet     Respiratory   Chronic rhinitis   Persistent.  Plan: Plan to address nasal symptoms in detail at next visit.        Endocrine   DM (diabetes mellitus), type 2 (HCC) - Primary   Stable,  Plan: Continue Synjardy and Trulicity Refill prescriptions sent to [facility] pharmacy for a 100-day supply. Schedule fasting lab work to assess glycemic control. Encourage continuation of daily blood glucose monitoring.      Relevant Medications   Blood Glucose Monitoring Suppl DEVI   Glucose Blood (BLOOD GLUCOSE TEST STRIPS) STRP   Lancet Device MISC   Lancets Misc. MISC   Empagliflozin-metFORMIN HCl ER (SYNJARDY XR) 05-999 MG TB24   TRULICITY 0.75 MG/0.5ML SOAJ   valsartan-hydrochlorothiazide (DIOVAN-HCT) 320-25 MG tablet     Other   Chronic abdominal pain   Reports worsening abdominal pain regardless of food intake; pending gastrointestinal evaluation.  Plan: Await results of scheduled colonoscopy and endoscopy on February 4th. Advise to report any new or worsening symptoms.      Mixed hyperlipidemia   Currently managed with icosapent Ethyl and pravastatin needs to be rechecked.  Plan: Continue current lipid-lowering therapy. Refill prescriptions sent to [facility] pharmacy for a 100-day supply. Include lipid panel in upcoming fasting lab work.       Relevant Medications   icosapent Ethyl (VASCEPA) 1 g capsule   valsartan-hydrochlorothiazide (DIOVAN-HCT) 320-25 MG tablet   Glaucoma of both eyes   Follows with ophthalmology and uses prescribed eyedrops.  Plan: Continue eyedrops and regular follow-up with ophthalmology for vision checks      Relevant Medications   dorzolamide-timolol (COSOPT) 2-0.5 % ophthalmic solution   Other Visit Diagnoses       Recurring abdominal pain           Medications Discontinued During This Encounter  Medication Reason   traMADol (ULTRAM) 50 MG tablet    naproxen (NAPROSYN) 375 MG tablet Change in therapy   brimonidine-timolol (COMBIGAN) 0.2-0.5 % ophthalmic solution Change in therapy   carboxymethylcellulose (REFRESH PLUS) 0.5 % SOLN Change in therapy   glucose monitoring kit (FREESTYLE) monitoring kit Change in therapy   Blood Glucose Monitoring Suppl (CONTOUR NEXT EZ MONITOR) W/DEVICE KIT Change in therapy   Colesevelam HCl (WELCHOL) 3.75 G PACK Change in therapy   omega-3 acid ethyl esters (LOVAZA) 1 G capsule Change in therapy   levocetirizine (XYZAL) 5 MG tablet Change in therapy   losartan-hydrochlorothiazide (HYZAAR) 100-12.5 MG per tablet Change in therapy   Linagliptin-Metformin HCl (JENTADUETO) 2.5-500 MG TABS Change in therapy   metFORMIN (GLUCOPHAGE) 500 MG tablet Change in therapy   AMLODIPINE BESYLATE PO    glucose blood (BAYER CONTOUR NEXT TEST) test strip    FREESTYLE LITE test strip    [employer]S Lancets Ultra Thin MISC    TRULICITY 0.75 MG/0.5ML SOAJ Reorder   icosapent Ethyl (VASCEPA) 1 g capsule Reorder   Empagliflozin-metFORMIN HCl  ER (SYNJARDY XR) 05-999 MG TB24 Reorder   valsartan-hydrochlorothiazide (DIOVAN-HCT) 320-25 MG tablet Reorder    Return in about 4 weeks (around 09/20/2023) for fasting labs, BP, DM, HLD.    Subjective:   Encounter date: 08/23/2023  Adrian [name] is a 83 y.o. 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 pylori), Hernia, inguinal, left (04/10/2012), HYPERCHOLESTEROLEMIA, Hypertriglyceridemia (12/07/2010), Pain of right thigh (06/08/2011), Parotid mass (12/13/2012), Personal history of colonic polyps, Plantar fasciitis, right (01/08/2012), Rash and nonspecific skin eruption (07/17/2013), RENAL CYST, Sciatica of right side (06/08/2011), Sialadenitis (12/13/2012), and Tinea cruris (04/10/2012).Adrian [name]   He presents with chief complaint of Establish Care .   HPI:   Chief Complaint: Establish care and manage chronic conditions.  History of Present Illness:  Patient is new to the practice due to previous provider's retirement. Reports worsening abdominal pain that occurs regardless of eating, more intense lately. Has colonoscopy and endoscopy scheduled for February 4th. Experiences year-round nasal congestion and runny nose. Monitors blood pressure daily at home; readings range from 120s-130s/60s mmHg. Monitors blood glucose daily; readings range from 103 to  151 mg/dL. Needs refills on all current medications. Requests influenza vaccination.  Review of Systems:  Constitutional: Denies fever, chills, weight loss. Eyes: History of glaucoma; managed by ophthalmologist. Cardiovascular: Denies chest pain, palpitations. Respiratory: Denies shortness of breath. Gastrointestinal: Reports abdominal pain; denies nausea, vomiting. Genitourinary: Denies increased urination, dysuria. Neurological: Denies headaches. Psychiatric: Denies anxiety, depression. All other systems: Negative.   Past Surgical History:  Procedure Laterality Date   CATARACT EXTRACTION     EYE SURGERY     (R) glaucoma (to relieve pressure)   HERNIA REPAIR     right inguinal   INGUINAL HERNIA REPAIR  04/29/2012   Procedure: HERNIA REPAIR INGUINAL ADULT;  Surgeon: Wilmon Arms. Tsuei, MD;  Location: [facility];  Service: General;  Laterality: Left;  Left inguinal hernia repair w/mesh   Left rotator cuff repair     right wrist surgery      Outpatient Medications Prior to Visit  Medication Sig Dispense Refill   dorzolamide (TRUSOPT) 2 % ophthalmic solution Place 1 drop into the right eye 2 (two) times daily.     dorzolamide-timolol (COSOPT) 2-0.5 % ophthalmic solution Place 1 drop into the right eye 2 (two) times daily.     famotidine (PEPCID) 40 MG tablet Take 40 mg by mouth daily.     fluocinonide-emollient (LIDEX-E) 0.05 % cream APPLY TO AFFECTED AREA TWICE A DAY 60 g 2   pravastatin (PRAVACHOL) 80 MG tablet Take 1 tablet (80 mg total) by mouth daily. 90 tablet 3   Travoprost, BAK Free, (TRAVATAN) 0.004 % SOLN ophthalmic solution Place 1 drop into the right eye at bedtime.     [employer]S Lancets Ultra Thin MISC TEST TWICE A DAY  AS DIRECTED 100 each 3   Empagliflozin-metFORMIN HCl ER (SYNJARDY XR) 05-999 MG TB24 Take 1 tablet by mouth daily.     FREESTYLE LITE test strip USE TWICE A DAY 100 each 11   glucose blood (BAYER CONTOUR NEXT TEST) test strip Use QD 100 each 12   icosapent Ethyl  (VASCEPA) 1 g capsule Take 2 g by mouth 2 (two) times daily.     TRULICITY 0.75 MG/0.5ML SOAJ Inject 0.75 mg into the skin once a week.     valsartan-hydrochlorothiazide (DIOVAN-HCT) 320-25 MG tablet Take 1 tablet by mouth daily.     AMLODIPINE BESYLATE PO Take 10 mg by mouth daily. (Patient not taking: Reported on 08/23/2023)     Blood Glucose Monitoring Suppl (CONTOUR NEXT EZ MONITOR) W/DEVICE KIT 1 Act by Does not apply route daily. 2 kit 0   brimonidine-timolol (COMBIGAN) 0.2-0.5 % ophthalmic solution Place 1 drop into the right eye every 12 (twelve) hours.     carboxymethylcellulose (REFRESH PLUS) 0.5 % SOLN Place 1 drop into both eyes 3 (three) times daily as needed. For dry eyes.     Colesevelam HCl [facility]) 3.75 G PACK Take 1 packet by mouth daily. 90 packet 3   glucose monitoring kit (FREESTYLE) monitoring kit 1 each by Does not apply route 2 (two) times daily. 1 each 3   levocetirizine (XYZAL) 5 MG tablet Take 1 tablet (5 mg total) by mouth every evening. 90 tablet 3   Linagliptin-Metformin HCl (JENTADUETO) 2.5-500 MG TABS 1 daily with largest meal 180 tablet 1   losartan-hydrochlorothiazide (HYZAAR) 100-12.5 MG per tablet TAKE 1 TABLET BY MOUTH DAILY. 90 tablet 3   metFORMIN (GLUCOPHAGE) 500 MG tablet Take 500 mg by mouth 2 (two) times daily with a meal.     naproxen (NAPROSYN) 375 MG tablet Take 375 mg by mouth 2 (two) times daily with a meal.     omega-3 acid ethyl esters (LOVAZA) 1 G capsule Take 2 capsules (2 g total) by mouth 2 (two) times daily. 360 capsule 3   traMADol (ULTRAM) 50 MG tablet Take 1 tablet (50 mg total) by mouth every 8 (eight) hours as needed. (Patient not taking: Reported on 08/23/2023) 65 tablet 2   No facility-administered medications prior to visit.    Family History  Problem Relation Age of Onset   Diabetes Mother    Diabetes Brother    Hearing loss Daughter    Hypertension Other    Cancer Neg Hx    Alcohol abuse Neg Hx    Early death Neg Hx    Heart  disease Neg Hx    Hyperlipidemia Neg Hx    Stroke Neg Hx     Social History   Socioeconomic History   Marital status: Divorced    Spouse name: Not on file   Number of children: 7   Years of education: Not on file   Highest education level: Not on file  Occupational History   Occupation: Retired  Tobacco Use   Smoking status: Former   Smokeless tobacco: Never   Tobacco comments:    Stopped 30 years ago  Advertising account planner   Vaping status: Never Used  Substance and Sexual Activity   Alcohol use: No   Drug use: No   Sexual activity: Yes  Other Topics Concern   Not on file  Social History Narrative   Not on file   Social Drivers of Health   Financial Resource Strain: Not on file  Food Insecurity: Not  on file  Transportation Needs: Not on file  Physical Activity: Not on file  Stress: Not on file  Social Connections: Not on file  Intimate Partner Violence: Not on file                                                                                                  Objective:  Physical Exam: BP 132/60 (BP Location: Left Arm, Patient Position: Sitting, Cuff Size: Normal)   Pulse 66   Temp (!) 97.3 F (36.3 C) (Temporal)   Ht 5' 1.61" (1.565 m)   Wt 136 lb 12.8 oz (62.1 kg)   SpO2 95%   BMI 25.34 kg/m     Physical Exam Constitutional:      Appearance: Normal appearance.  HENT:     Head: Normocephalic and atraumatic.     Right Ear: Hearing normal.     Left Ear: Hearing normal.     Nose: Nose normal.  Eyes:     General: No scleral icterus.       Right eye: No discharge.        Left eye: No discharge.     Extraocular Movements: Extraocular movements intact.  Cardiovascular:     Rate and Rhythm: Normal rate and regular rhythm.     Heart sounds: Normal heart sounds.  Pulmonary:     Effort: Pulmonary effort is normal.     Breath sounds: Normal breath sounds.  Abdominal:     Palpations: Abdomen is soft.     Tenderness: There is no abdominal tenderness.  Skin:     General: Skin is warm.     Findings: No rash.  Neurological:     General: No focal deficit present.     Mental Status: He is alert.     Cranial Nerves: No cranial nerve deficit.  Psychiatric:        Mood and Affect: Mood normal.        Behavior: Behavior normal.        Thought Content: Thought content normal.        Judgment: Judgment normal.     No results found.  No results found for this or any previous visit (from the past 2160 hours).      Garner Nash, MD, MS

## 2023-08-24 DIAGNOSIS — J310 Chronic rhinitis: Secondary | ICD-10-CM | POA: Insufficient documentation

## 2023-08-24 DIAGNOSIS — H409 Unspecified glaucoma: Secondary | ICD-10-CM | POA: Insufficient documentation

## 2023-08-24 DIAGNOSIS — E782 Mixed hyperlipidemia: Secondary | ICD-10-CM | POA: Insufficient documentation

## 2023-08-24 NOTE — Assessment & Plan Note (Signed)
Currently managed with icosapent Ethyl and pravastatin needs to be rechecked.  Plan: Continue current lipid-lowering therapy. Refill prescriptions sent to [facility] pharmacy for a 100-day supply. Include lipid panel in upcoming fasting lab work.

## 2023-08-24 NOTE — Assessment & Plan Note (Signed)
Follows with ophthalmology and uses prescribed eyedrops.  Plan: Continue eyedrops and regular follow-up with ophthalmology for vision checks

## 2023-08-24 NOTE — Assessment & Plan Note (Signed)
Stable,  Plan: Continue Synjardy and Trulicity Refill prescriptions sent to [facility] pharmacy for a 100-day supply. Schedule fasting lab work to assess glycemic control. Encourage continuation of daily blood glucose monitoring.

## 2023-08-24 NOTE — Assessment & Plan Note (Signed)
Stable, blood pressure within target range on valsartan hydrochlorothiazide Plan: Continue current antihypertensive medications. Refill prescriptions sent to [facility] pharmacy for a 100-day supply. Encourage continuation of daily blood pressure monitoring.

## 2023-08-24 NOTE — Assessment & Plan Note (Signed)
Persistent.  Plan: Plan to address nasal symptoms in detail at next visit.

## 2023-08-24 NOTE — Assessment & Plan Note (Signed)
Reports worsening abdominal pain regardless of food intake; pending gastrointestinal evaluation.  Plan: Await results of scheduled colonoscopy and endoscopy on February 4th. Advise to report any new or worsening symptoms.

## 2023-08-26 ENCOUNTER — Other Ambulatory Visit: Payer: Self-pay | Admitting: Family Medicine

## 2023-08-26 DIAGNOSIS — E1139 Type 2 diabetes mellitus with other diabetic ophthalmic complication: Secondary | ICD-10-CM

## 2023-08-30 ENCOUNTER — Other Ambulatory Visit: Payer: Self-pay | Admitting: Family Medicine

## 2023-09-11 ENCOUNTER — Ambulatory Visit (INDEPENDENT_AMBULATORY_CARE_PROVIDER_SITE_OTHER): Payer: 59 | Admitting: Gastroenterology

## 2023-09-11 ENCOUNTER — Other Ambulatory Visit (INDEPENDENT_AMBULATORY_CARE_PROVIDER_SITE_OTHER): Payer: 59

## 2023-09-11 ENCOUNTER — Encounter: Payer: Self-pay | Admitting: Gastroenterology

## 2023-09-11 VITALS — BP 98/60 | HR 61 | Ht 61.0 in | Wt 136.0 lb

## 2023-09-11 DIAGNOSIS — G8929 Other chronic pain: Secondary | ICD-10-CM | POA: Diagnosis not present

## 2023-09-11 DIAGNOSIS — R109 Unspecified abdominal pain: Secondary | ICD-10-CM

## 2023-09-11 DIAGNOSIS — R10815 Periumbilic abdominal tenderness: Secondary | ICD-10-CM | POA: Diagnosis not present

## 2023-09-11 LAB — CBC WITH DIFFERENTIAL/PLATELET
Basophils Absolute: 0.1 10*3/uL (ref 0.0–0.1)
Basophils Relative: 1.2 % (ref 0.0–3.0)
Eosinophils Absolute: 0.2 10*3/uL (ref 0.0–0.7)
Eosinophils Relative: 3.6 % (ref 0.0–5.0)
HCT: 44.8 % (ref 39.0–52.0)
Hemoglobin: 15.1 g/dL (ref 13.0–17.0)
Lymphocytes Relative: 25.9 % (ref 12.0–46.0)
Lymphs Abs: 1.5 10*3/uL (ref 0.7–4.0)
MCHC: 33.7 g/dL (ref 30.0–36.0)
MCV: 93.7 fL (ref 78.0–100.0)
Monocytes Absolute: 0.6 10*3/uL (ref 0.1–1.0)
Monocytes Relative: 10.9 % (ref 3.0–12.0)
Neutro Abs: 3.3 10*3/uL (ref 1.4–7.7)
Neutrophils Relative %: 58.4 % (ref 43.0–77.0)
Platelets: 191 10*3/uL (ref 150.0–400.0)
RBC: 4.79 Mil/uL (ref 4.22–5.81)
RDW: 14 % (ref 11.5–15.5)
WBC: 5.7 10*3/uL (ref 4.0–10.5)

## 2023-09-11 LAB — COMPREHENSIVE METABOLIC PANEL
ALT: 18 U/L (ref 0–53)
AST: 16 U/L (ref 0–37)
Albumin: 4.5 g/dL (ref 3.5–5.2)
Alkaline Phosphatase: 48 U/L (ref 39–117)
BUN: 18 mg/dL (ref 6–23)
CO2: 30 meq/L (ref 19–32)
Calcium: 9.2 mg/dL (ref 8.4–10.5)
Chloride: 99 meq/L (ref 96–112)
Creatinine, Ser: 0.99 mg/dL (ref 0.40–1.50)
GFR: 69.9 mL/min (ref >60.00)
Glucose, Bld: 103 mg/dL — ABNORMAL HIGH (ref 70–99)
Potassium: 4 meq/L (ref 3.5–5.1)
Sodium: 137 meq/L (ref 135–145)
Total Bilirubin: 2.2 mg/dL — ABNORMAL HIGH (ref 0.2–1.2)
Total Protein: 7.1 g/dL (ref 6.0–8.3)

## 2023-09-11 LAB — LIPASE: Lipase: 284 U/L — ABNORMAL HIGH (ref 11.0–59.0)

## 2023-09-11 NOTE — Patient Instructions (Addendum)
 You have been scheduled for a CT scan of the abdomen and pelvis at [facility], 1st floor Radiology. You are scheduled on Tuesday, 2-11 at 4:30pm. You should arrive 4:15am, prior to your appointment time for registration.   Please be sure to be well hydrated.  Plan on being there 30 to 60 minutes, depending on the type of exam you are having performed.   If you have any questions regarding your exam or if you need to reschedule, you may call [facility] Radiology Scheduling at [phone] between the hours of 8:00 am and 5:00 pm, Monday-Friday.     Please go to the lab in the basement of our building to have lab work done as you leave today. Hit B for basement when you get on the elevator.  When the doors open the lab is on your left.  We will call you with the results. Thank you.  We will request your colonoscopy records from [facility].   Thank you for entrusting me with your care and for choosing [facility], Dr. Elspeth Naval    If your blood pressure at your visit was 140/90 or greater, please contact your primary care physician to follow up on this. ______________________________________________________  If you are age 22 or older, your body mass index should be between 23-30. Your Body mass index is 25.7 kg/m. If this is out of the aforementioned range listed, please consider follow up with your Primary Care Provider.  If you are age 75 or younger, your body mass index should be between 19-25. Your Body mass index is 25.7 kg/m. If this is out of the aformentioned range listed, please consider follow up with your Primary Care Provider.  ________________________________________________________  The [facility] providers would like to encourage you to use MYCHART to communicate with providers for non-urgent requests or questions.  Due to long hold times on the telephone, sending your provider a message by [facility] may be a faster and more efficient way to get a  response.  Please allow 48 business hours for a response.  Please remember that this is for non-urgent requests.  _______________________________________________________  Due to recent changes in healthcare laws, you may see the results of your imaging and laboratory studies on MyChart before your provider has had a chance to review them.  We understand that in some cases there may be results that are confusing or concerning to you. Not all laboratory results come back in the same time frame and the provider may be waiting for multiple results in order to interpret others.  Please give us  48 hours in order for your provider to thoroughly review all the results before contacting the office for clarification of your results.

## 2023-09-11 NOTE — Progress Notes (Signed)
 HPI :  83 year old male with a history of diabetes, hypertension, reported history of colon polyps, here to establish care for abdominal pain.  Referred by Dr. Beverley Hummer.  Patient reports abdominal pain ongoing for years.  When asked how long specifically he can only say several years, unclear.  Partner in the room today states this has been going on for extremely long time.  He describes abdominal pain all over.  He states he cannot localize it, he feels it perhaps more so in his epigastric area but generally feels it through his entire abdomen.  He states he was once told he had inflammation of his bowels after a colonoscopy 5 to 10 years ago with Dr. Lennard, but states this was not the cause of his symptoms and no treatment was needed.  We have no reports of that today.  He states that discomfort tends to come and go.  He will often feel it roughly 2 times per week but sometimes does not feel at all at all.  The pain can last for minutes to hours.  His partner reports that he is frequently walking around the house holding his abdomen and more recently the pain seems to be more frequent and severe.  He denies any nausea or vomiting, he denies any changes in his bowel habits.  No blood in his stools.  Bowel habits are regular.  He denies any clear triggers to his symptoms.  It is hard for him to say if eating can make his symptoms any worse.  He does not have any clear triggers.  He has had stable weight and no new weight loss.  He denies any new medications, states has been on Trulicity  for several years however.  His gallbladder is in place.  No family history of colon cancer.  He does have a remote tobacco history.  Generally he is frustrated by his persistent symptoms over years.  From what I can gather here today he had an EGD in 2011 which was normal and a colonoscopy at that time which was also normal.  He last had imaging of his abdomen almost 5 years ago with a CT scan which was  normal.  I do not see any blood work on file within the past 5 years or so.  They are not sure when his last labs were.  Echo 07/10/2018 - EF 60-65%, grade I DD   EGD 01/05/10: ENDOSCOPIC IMPRESSION:     1) Normal GE junction     2) Normal ampulla     3) Normal duodenal folds     4) Moderate gastritis in the total stomach     R/O H.PYLORI AND OTHER CAUSES OF CHRONIC GASTRITIS.     RECOMMENDATIONS:     1) Avoid NSAIDS for two weeks     2) Await biopsy results     3) continue current meds   Colonoscopy 11/10/2009: FINDINGS:  No polyps or cancers were seen.  This was otherwise a     normal examination of the colon.   Retroflexed views in the rectum     revealed no abnormalities.    The scope was then withdrawn from     the patient and the procedure completed.   CT abdomen / pelvis 03/19/2019: IMPRESSION: 1. No acute intra-abdominal or pelvic pathology. 2. Colonic diverticulosis. No bowel obstruction or active inflammation. Normal appendix. 3. Aortic Atherosclerosis (ICD10-I70.0).   Past Medical History:  Diagnosis Date   Benign neoplasm of colon 05/01/2008  Qualifier: Diagnosis of  By: Joshua MD, Debby CROME.    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 pylori)    Hernia, inguinal, left 04/10/2012   HYPERCHOLESTEROLEMIA    Hypertriglyceridemia 12/07/2010   Pain of right thigh 06/08/2011   Parotid mass 12/13/2012   Personal history of colonic polyps    Plantar fasciitis, right 01/08/2012   Rash and nonspecific skin eruption 07/17/2013   RENAL CYST    Sciatica of right side 06/08/2011   Sialadenitis 12/13/2012   Tinea cruris 04/10/2012     Past Surgical  History:  Procedure Laterality Date   CATARACT EXTRACTION     EYE SURGERY     (R) glaucoma (to relieve pressure)   HERNIA REPAIR     right inguinal   INGUINAL HERNIA REPAIR  04/29/2012   Procedure: HERNIA REPAIR INGUINAL ADULT;  Surgeon: Donnice POUR. Tsuei, MD;  Location: [facility] OR;  Service: General;  Laterality: Left;  Left inguinal hernia repair w/mesh   Left rotator cuff repair     right wrist surgery     Family History  Problem Relation Age of Onset   Diabetes Mother    Diabetes Brother    Hearing loss Daughter    Hypertension Other    Cancer Neg Hx    Alcohol abuse Neg Hx    Early death Neg Hx    Heart disease Neg Hx    Hyperlipidemia Neg Hx    Stroke Neg Hx    Social History   Tobacco Use   Smoking status: Former   Smokeless tobacco: Never   Tobacco comments:    Stopped 30 years ago  Vaping Use   Vaping status: Never Used  Substance Use Topics   Alcohol use: No   Drug use: No   Current Outpatient Medications  Medication Sig Dispense Refill   Blood Glucose Monitoring Suppl DEVI 1 each by Does not apply route in the morning, at noon, and at bedtime. May substitute to any manufacturer covered by patient's insurance. 1 each 0   dorzolamide (TRUSOPT) 2 % ophthalmic solution Place 1 drop into the right eye 2 (two) times daily.     dorzolamide-timolol (COSOPT) 2-0.5 % ophthalmic solution Place 1 drop into the right eye 2 (two) times daily.     Empagliflozin-metFORMIN  HCl ER (SYNJARDY  XR) 05-999 MG TB24 Take 1 tablet by mouth daily. 100 tablet 0   famotidine (PEPCID) 40 MG tablet Take 40 mg by mouth daily.     fluocinonide -emollient (LIDEX -E) 0.05 % cream APPLY TO AFFECTED AREA TWICE A DAY 60 g 2   icosapent  Ethyl (VASCEPA ) 1 g capsule Take 2 capsules (2 g total) by mouth 2 (two) times daily. 400 capsule 0   Lancet Device MISC 1 each by Does not apply route in the morning, at noon, and at bedtime. May substitute to any manufacturer covered by patient's insurance. 1 each 0    Lancets (ONETOUCH DELICA PLUS LANCET33G) MISC USE 1 LANCET IN THE MORNING , AT NOON, AND AT BEDTIME 100 each 9   Lancets Misc. MISC 1 each by Does not apply route in the morning, at noon, and at bedtime. May substitute  to any manufacturer covered by patient's insurance. 100 each 0   ONETOUCH VERIO test strip TEST IN THE MORNING AT NOON, AND AT BEDTIME 100 strip 9   pravastatin  (PRAVACHOL ) 80 MG tablet Take 1 tablet (80 mg total) by mouth daily. 90 tablet 3   Travoprost, BAK Free, (TRAVATAN) 0.004 % SOLN ophthalmic solution Place 1 drop into the right eye at bedtime.     TRULICITY  0.75 MG/0.5ML SOAJ Inject 0.75 mg into the skin once a week. 7 mL 0   valsartan -hydrochlorothiazide  (DIOVAN -HCT) 320-25 MG tablet Take 1 tablet by mouth daily. 100 tablet 0   No current facility-administered medications for this visit.   Allergies  Allergen Reactions   Lisinopril     REACTION: cough     Review of Systems: All systems reviewed and negative except where noted in HPI.    Physical Exam: BP 98/60   Pulse 61   Ht 5' 1 (1.549 m)   Wt 136 lb (61.7 kg)   BMI 25.70 kg/m  Constitutional: Pleasant,well-developed, male in no acute distress. HEENT: Normocephalic and atraumatic. Conjunctivae are normal. No scleral icterus. Neck supple.  Cardiovascular: Normal rate, regular rhythm.  Pulmonary/chest: Effort normal and breath sounds normal. No wheezing, rales or rhonchi. Abdominal: Soft, nondistended, some mild tenderness R of periumbilical area. Mild diastasis recti. Negative Carnett. There are no masses palpable. No hepatomegaly. Extremities: no edema Lymphadenopathy: No cervical adenopathy noted. Neurological: Alert and oriented to person place and time. Skin: Skin is warm and dry. No rashes noted. Psychiatric: Normal mood and affect. Behavior is normal.   ASSESSMENT: 83 y.o. male here for assessment of the following  1. Chronic abdominal pain    Unclear what is causing his symptoms based on  our initial visit today.  He has had years of chronic intermittent diffuse abdominal pain.  He has a really difficult time clearly describing his symptoms or providing any clear triggers or localization to this discomfort.  On exam he has some tenderness around his periumbilical area although he thinks perhaps epigastric location could be worse than other areas.  Discussed options with him, it appears he has not had some basic labs in some time, will check CBC, CMET, lipase.  Given his nonspecific symptoms and progressive worsening of pain over time, recommend CT scan abdomen pelvis to evaluate.  I will otherwise get records of his last colonoscopy Dr. Ulus office to clarify history of polyps or what they found in regards to this comments of bowel inflammation.  He does not have any diarrhea that bothers him.  He has also been on Trulicity  for the past few years, unclear what relationship this has to his symptoms but outside of pain he really does not have any nausea vomiting or bowel changes.  Following full discussion they agree with the plan as outlined.  PLAN: - lab today for CBC, CMET, lipase - schedule CT scan abdomen / pelvis  - get records of prior EGD / colonoscopy Dr. Ulus office @ [facility]  Further recommendations pending the results of his workup.  Could consider empiric PPI in the interim however again his symptoms are rather diffuse and not clearly upper tract in etiology.  Marcey Naval, MD [facility]

## 2023-09-18 ENCOUNTER — Ambulatory Visit (HOSPITAL_COMMUNITY)
Admission: RE | Admit: 2023-09-18 | Discharge: 2023-09-18 | Disposition: A | Discharge status: Home / Self Care | Payer: 59 | Source: Ambulatory Visit | Attending: Gastroenterology | Admitting: Gastroenterology

## 2023-09-18 DIAGNOSIS — G8929 Other chronic pain: Secondary | ICD-10-CM | POA: Insufficient documentation

## 2023-09-18 DIAGNOSIS — R109 Unspecified abdominal pain: Secondary | ICD-10-CM | POA: Diagnosis present

## 2023-09-18 MED ORDER — IOHEXOL 300 MG/ML  SOLN
100.0000 mL | Freq: Once | INTRAMUSCULAR | Status: AC | PRN
  Administered 2023-09-18: 100 mL via INTRAVENOUS

## 2023-09-20 ENCOUNTER — Other Ambulatory Visit: Payer: 59

## 2023-10-11 ENCOUNTER — Telehealth: Payer: Self-pay | Admitting: Gastroenterology

## 2023-10-11 ENCOUNTER — Other Ambulatory Visit: Payer: Self-pay | Admitting: Family Medicine

## 2023-10-11 DIAGNOSIS — E1139 Type 2 diabetes mellitus with other diabetic ophthalmic complication: Secondary | ICD-10-CM

## 2023-10-11 NOTE — Telephone Encounter (Signed)
 Results of prior endoscopic evaluation arrived:  EGD - Dr. Evette Cristal - 10/21/2014 - normal  Colonoscopy - Dr. Evette Cristal - 10/21/2014 - good prep, normal exam

## 2023-10-27 ENCOUNTER — Other Ambulatory Visit: Payer: Self-pay | Admitting: Family Medicine

## 2023-10-27 DIAGNOSIS — E1139 Type 2 diabetes mellitus with other diabetic ophthalmic complication: Secondary | ICD-10-CM

## 2023-10-31 ENCOUNTER — Other Ambulatory Visit: Payer: Self-pay | Admitting: Family Medicine

## 2023-10-31 DIAGNOSIS — E782 Mixed hyperlipidemia: Secondary | ICD-10-CM

## 2023-10-31 DIAGNOSIS — I152 Hypertension secondary to endocrine disorders: Secondary | ICD-10-CM

## 2024-03-28 ENCOUNTER — Other Ambulatory Visit: Payer: Self-pay | Admitting: Family Medicine

## 2024-03-28 DIAGNOSIS — E1139 Type 2 diabetes mellitus with other diabetic ophthalmic complication: Secondary | ICD-10-CM

## 2024-03-28 MED ORDER — BLOOD GLUCOSE MONITORING SUPPL DEVI: 1.0000 | Freq: Three times a day (TID) | 0 refills | Status: AC

## 2024-05-06 ENCOUNTER — Encounter: Payer: Self-pay | Admitting: Family Medicine

## 2024-05-06 ENCOUNTER — Ambulatory Visit (INDEPENDENT_AMBULATORY_CARE_PROVIDER_SITE_OTHER): Admitting: Family Medicine

## 2024-05-06 VITALS — BP 124/60 | HR 61 | Temp 97.2°F | Resp 18 | Wt 133.6 lb

## 2024-05-06 DIAGNOSIS — L661 Lichen planopilaris, unspecified: Secondary | ICD-10-CM | POA: Diagnosis not present

## 2024-05-06 DIAGNOSIS — I10 Essential (primary) hypertension: Secondary | ICD-10-CM | POA: Diagnosis not present

## 2024-05-06 DIAGNOSIS — E1100 Type 2 diabetes mellitus with hyperosmolarity without nonketotic hyperglycemic-hyperosmolar coma (NKHHC): Secondary | ICD-10-CM | POA: Diagnosis not present

## 2024-05-06 MED ORDER — CLOBETASOL PROPIONATE 0.05 % EX OINT: 1.0000 | TOPICAL_OINTMENT | Freq: Two times a day (BID) | CUTANEOUS | 0 refills | Status: AC

## 2024-05-06 MED ORDER — HYDROXYZINE PAMOATE 25 MG PO CAPS
25.0000 mg | ORAL_CAPSULE | Freq: Three times a day (TID) | ORAL | 0 refills | Status: AC | PRN
Start: 2024-05-06 — End: ?

## 2024-05-06 NOTE — Progress Notes (Signed)
 Assessment & Plan   Assessment/Plan:    Assessment & Plan Lichen planopilaris with pruritus and excoriation Chronic pruritic rash for one year on back, arms, and thighs. Lesions are violaceous with follicular papules, large plaques, flat-topped papules, and excoriation from scratching. Previous treatment with unknown cream was ineffective. Diagnosis by dermatologist. Chronic inflammatory condition with intermittent symptoms. - Prescribe clobetasol ointment 0.05% to be applied twice daily to lesions. - Prescribe hydroxyzine  25 mg every 8 hours as needed for itching, cautioning potential drowsiness. - Refer to dermatology for further evaluation   Hypertension Blood pressure 183/92 in office, significantly elevated compared to home readings of 124/60.  Currently takes valsartan -hydrochlorothiazide  320-25 mg daily.  Reports elevated due to the stress of having to take wife to the hospital. - Recommend checking blood pressure keeping a log -Follow-up in 1 month recheck blood pressure  Type 2 diabetes mellitus - Due for microalbumin creatinine ratio and diabetic eye exam.  To monitor kidney function in context of diabetes. - Obtain urine sample for microalbumin creatinine ratio. - Recommend follow-up with ophthalmology for retinal screen      There are no discontinued medications.  Return in about 1 month (around 06/05/2024) for BP, rash.        Subjective:   Encounter date: 05/06/2024  Adrian [name] is a 83 y.o. 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 pylori), Hernia, inguinal, left (04/10/2012), HYPERCHOLESTEROLEMIA, Hypertriglyceridemia (12/07/2010), Pain of right thigh (06/08/2011), Parotid mass (12/13/2012), Personal history of colonic polyps, Plantar fasciitis, right (01/08/2012), Rash and nonspecific skin eruption (07/17/2013), RENAL CYST, Sciatica of right side (06/08/2011), Sialadenitis (12/13/2012), and Tinea cruris (04/10/2012).Adrian [name]   He presents with chief complaint of Rash (Pt c/o of rash on back, arms right and thigh for 1 year; very itchy. Pt used OTC cream //HM due- vaccinations and diabetic eye exam ) and Hypertension (Pt blood pressure readings at home range from 124/60; currently taking valsartan -hydrochlorothiazide ) .   Discussed the use of AI scribe software for clinical note transcription with the patient, who gave verbal consent to proceed.  History of Present Illness Adrian [name] is an 83 year old male with type 2 diabetes who presents with a pruritic rash on his back, arms, and thighs.  Pruritic rash - Pruritic rash present for approximately one year - Initially appeared on the back, subsequently spreading to the arms, thighs, and chest - Rash is intermittent, described as 'comes and goes' - Pruritus can be severe, described as 'terribly itchy' at times - Previously diagnosed with lichen planopilaris - Previously prescribed a topical cream (name unknown) that provided  temporary relief; rash recurred after discontinuation - Currently not using any specific medication for the rash  Type 2 diabetes mellitus - History of type 2 diabetes mellitus - Due for microalbumin creatinine ratio to  assess for microalbuminuria  Blood pressure elevation - Blood pressure measured at 183/92 in clinic - Home blood pressure readings typically around 124/60     ROS  Past Surgical History:  Procedure Laterality Date   CATARACT EXTRACTION     EYE SURGERY     (R) glaucoma (to relieve pressure)   HERNIA REPAIR     right inguinal   INGUINAL HERNIA REPAIR  04/29/2012   Procedure: HERNIA REPAIR INGUINAL ADULT;  Surgeon: Donnice POUR. Tsuei, MD;  Location: [facility] OR;  Service: General;  Laterality: Left;  Left inguinal hernia repair w/mesh   Left rotator cuff repair     right wrist surgery      Outpatient Medications Prior to Visit  Medication Sig Dispense Refill   Blood Glucose Monitoring Suppl DEVI 1 each by Does not apply route in the morning, at noon, and at bedtime. May substitute to any manufacturer covered by patient's insurance. 1 each 0   brimonidine (ALPHAGAN) 0.2 % ophthalmic solution Place 1 drop into the right eye 2 (two) times daily.     dorzolamide (TRUSOPT) 2 % ophthalmic solution Place 1 drop into the right eye 2 (two) times daily.     dorzolamide-timolol (COSOPT) 2-0.5 % ophthalmic solution Place 1 drop into the right eye 2 (two) times daily.     Empagliflozin-metFORMIN  HCl ER (SYNJARDY  XR) 05-999 MG TB24 Take 1 tablet by mouth daily. 90 tablet 3   famotidine (PEPCID) 40 MG tablet Take 40 mg by mouth daily.     fluocinonide -emollient (LIDEX -E) 0.05 % cream APPLY TO AFFECTED AREA TWICE A DAY 60 g 2   Lancets (ONETOUCH DELICA PLUS LANCET33G) MISC USE 1 LANCET IN THE MORNING , AT NOON, AND AT BEDTIME 100 each 9   ONETOUCH VERIO test strip TEST IN THE MORNING AT NOON, AND AT BEDTIME 100 strip 9   pravastatin  (PRAVACHOL ) 80 MG tablet Take 1 tablet (80 mg total) by  mouth daily. 90 tablet 3   Travoprost, BAK Free, (TRAVATAN) 0.004 % SOLN ophthalmic solution Place 1 drop into the right eye at bedtime.     TRULICITY  0.75 MG/0.5ML SOAJ INJECT THE CONTENTS OF ONE PEN  SUBCUTANEOUSLY WEEKLY AS  DIRECTED 6 mL 3   valsartan -hydrochlorothiazide  (DIOVAN -HCT) 320-25 MG tablet TAKE 1 TABLET BY MOUTH DAILY 100 tablet 2   VASCEPA  1 g capsule TAKE 2 CAPSULES BY MOUTH TWICE  DAILY 400 capsule 2   XIIDRA 5 % SOLN      No facility-administered medications prior to visit.    Family History  Problem Relation Age of Onset   Diabetes Mother    Diabetes Brother    Hearing loss Daughter    Hypertension Other    Cancer Neg Hx    Alcohol abuse Neg Hx    Early death Neg Hx    Heart disease Neg Hx    Hyperlipidemia Neg Hx    Stroke Neg Hx     Social History   Socioeconomic History   Marital status: Divorced    Spouse name: Not on file   Number of children: 7   Years of education: Not on file   Highest education level: Not on file  Occupational History   Occupation: Retired   Occupation: retired  Tobacco Use   Smoking status: Former   Smokeless tobacco: Never   Tobacco comments:    Stopped 30 years ago  Advertising account planner   Vaping status: Never Used  Substance and Sexual Activity   Alcohol use: No  Drug use: No   Sexual activity: Yes  Other Topics Concern   Not on file  Social History Narrative   Not on file   Social Drivers of Health   Financial Resource Strain: Not on file  Food Insecurity: Not on file  Transportation Needs: Not on file  Physical Activity: Not on file  Stress: Not on file  Social Connections: Not on file  Intimate Partner Violence: Not on file                                                                                                  Objective:  Physical Exam: BP 124/60 Comment: @home  reading  Pulse 61   Temp (!) 97.2 F (36.2 C) (Temporal)   Resp 18   Wt 133 lb 9.6 oz (60.6 kg)   SpO2 98%   BMI 25.24 kg/m   BP  Readings from Last 3 Encounters:  05/06/24 124/60  09/11/23 98/60  08/23/23 132/60    Physical Exam VITALS: BP- 183/92 GENERAL: Alert, cooperative, well developed, no acute distress. HEENT: Normocephalic, normal oropharynx, moist mucous membranes. CHEST: Clear to auscultation bilaterally. No wheezes, rhonchi, or crackles. CARDIOVASCULAR: Normal heart rate and rhythm, S1 and S2 normal without murmurs. ABDOMEN: Soft, non-tender, non-distended, without organomegaly. Normal bowel sounds. EXTREMITIES: No cyanosis or edema. NEUROLOGICAL: Cranial nerves grossly intact. Moves all extremities without gross motor or sensory deficit. SKIN: Violaceous lesions with follicular papules, solitary large plaques, and flat top papules on glabrous skin. Excoriation and lichenification due to chronic scratching. Lesions present on right thumb, right anterior thigh, and back   Physical Exam  No results found.  No results found for this or any previous visit (from the past 2160 hours).      Beverley Adine Hummer, MD, MS

## 2024-05-06 NOTE — Patient Instructions (Addendum)
  VISIT SUMMARY: Today, you were seen for a chronic itchy rash and your type 2 diabetes management. Your blood pressure was also noted to be elevated during the visit.  YOUR PLAN: LICHEN PLANOPILARIS WITH PRURITUS AND EXCORIATION: You have a chronic itchy rash that has been present for about a year, affecting your back, arms, and thighs. It has been diagnosed as lichen planopilaris, a chronic inflammatory condition. -Apply clobetasol ointment 0.05% to the affected areas twice daily. -Take hydroxyzine  25 mg every 8 hours as needed for itching. Be aware that this medication may cause drowsiness. -You will be referred to a dermatologist in [facility] for further evaluation and management.  HYPERTENSION: Your blood pressure was significantly elevated at 183/92 during today's visit, which is higher than your usual home readings. -Monitor your blood pressure at home and keep a log of your readings. -Follow up in 1 month to discuss your blood pressure management.  TYPE 2 DIABETES MELLITUS: You have a history of type 2 diabetes and are due for a test to monitor your kidney function. -Provide a urine sample for a microalbumin creatinine ratio test to assess your kidney function.                      Contains text generated by Abridge.                                 Contains text generated by Abridge.

## 2024-05-07 LAB — MICROALBUMIN / CREATININE URINE RATIO
Creatinine,U: 76.3 mg/dL
Microalb Creat Ratio: 16.1 mg/g (ref 0.0–30.0)
Microalb, Ur: 1.2 mg/dL (ref 0.0–1.9)

## 2024-05-12 ENCOUNTER — Ambulatory Visit: Payer: Self-pay | Admitting: Family Medicine

## 2024-06-05 ENCOUNTER — Ambulatory Visit: Admitting: Family Medicine

## 2024-06-05 NOTE — Progress Notes (Incomplete)
 Assessment & Plan   Assessment/Plan:    Problem List Items Addressed This Visit   None       Assessment and Plan               There are no discontinued medications.  No follow-ups on file.        Subjective:   Encounter date: 06/05/2024  Adrian [name] is a 83 y.o. male who has DM (diabetes mellitus), type 2 (HCC); HYPERCHOLESTEROLEMIA; ERECTILE DYSFUNCTION; Chronic angle-closure glaucoma(365.23); Hypertension associated with type 2 diabetes mellitus (HCC); GASTRITIS; RENAL CYST; Hypertriglyceridemia; DJD (degenerative joint disease); Tinea cruris; Eczema; BPH (benign prostatic hyperplasia); Routine general medical examination at a health care facility; IBS (irritable bowel syndrome); Chronic abdominal pain; Right hip pain; Low back pain; Headache; Bradycardia; Atypical chest pain; Mixed hyperlipidemia; Glaucoma of both eyes; and Chronic rhinitis on their problem list..   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 pylori), Hernia, inguinal, left (04/10/2012), HYPERCHOLESTEROLEMIA, Hypertriglyceridemia (12/07/2010), Pain of right thigh (06/08/2011), Parotid mass (12/13/2012), Personal history of colonic polyps, Plantar fasciitis, right (01/08/2012), Rash and nonspecific skin eruption (07/17/2013), RENAL CYST, Sciatica of right side (06/08/2011), Sialadenitis (12/13/2012), and Tinea cruris (04/10/2012).[name]   He presents with chief complaint of No chief complaint on file.    Discussed the use of AI scribe software for clinical note transcription with the patient, who gave verbal consent to  proceed.  History of Present Illness          Lichen Planopilaris  - Ongoing for the past year with pruritic rash occurring on back, arms, and thighs.  - Prescribed Clobetasol ointment to be applied BID to lesions, Hydroxyzine  25 mg Q8 hours PRN - Has previously seen a Dermatologist, which is who diagnosed him, but was not established with one at last office visit.   Diabetes Mellitus, type II - Microalb/Creat ratio 16.1 on 05/06/2024 - Managed with Synjardy  10 - 1000 mg daily and Trulicity  0.75 mg weekly   Hypertension - Managed with Valsartan -Hydrochlorothiazide  320-25 mg daily - Blood pressures in office on 05/06/2024 were initially elevated at 179/92 mmHg and 183/92 mmHg before normalizing - Around that time he did endorse situational stress with wife having to go to the hospital.  - He was recommended to keep a blood pressure log to monitor his at home blood pressures.    ROS  Past Surgical History:  Procedure Laterality Date   CATARACT EXTRACTION     EYE SURGERY     (R) glaucoma (to relieve pressure)   HERNIA REPAIR     right inguinal   INGUINAL HERNIA REPAIR  04/29/2012   Procedure: HERNIA REPAIR INGUINAL ADULT;  Surgeon: Adrian POUR. Tsuei, MD;  Location: [facility] OR;  Service: General;  Laterality: Left;  Left inguinal hernia repair w/mesh   Left rotator cuff repair     right wrist surgery      Current Outpatient Medications on File Prior to Visit  Medication Sig Dispense Refill   Blood Glucose Monitoring Suppl DEVI 1 each by Does not apply route in the morning, at noon, and at bedtime. May substitute to any manufacturer covered by patient's insurance. 1 each 0   brimonidine (ALPHAGAN) 0.2 % ophthalmic solution Place 1 drop into the right eye 2 (two) times  daily.     clobetasol ointment (TEMOVATE) 0.05 % Apply 1 Application topically 2 (two) times daily. 30 g 0   dorzolamide (TRUSOPT) 2 % ophthalmic solution Place 1 drop into the right eye 2 (two) times daily.      dorzolamide-timolol (COSOPT) 2-0.5 % ophthalmic solution Place 1 drop into the right eye 2 (two) times daily.     Empagliflozin-metFORMIN  HCl ER (SYNJARDY  XR) 05-999 MG TB24 Take 1 tablet by mouth daily. 90 tablet 3   famotidine (PEPCID) 40 MG tablet Take 40 mg by mouth daily.     fluocinonide -emollient (LIDEX -E) 0.05 % cream APPLY TO AFFECTED AREA TWICE A DAY 60 g 2   hydrOXYzine  (VISTARIL ) 25 MG capsule Take 1 capsule (25 mg total) by mouth every 8 (eight) hours as needed. 30 capsule 0   Lancets (ONETOUCH DELICA PLUS LANCET33G) MISC USE 1 LANCET IN THE MORNING , AT NOON, AND AT BEDTIME 100 each 9   ONETOUCH VERIO test strip TEST IN THE MORNING AT NOON, AND AT BEDTIME 100 strip 9   pravastatin  (PRAVACHOL ) 80 MG tablet Take 1 tablet (80 mg total) by mouth daily. 90 tablet 3   Travoprost, BAK Free, (TRAVATAN) 0.004 % SOLN ophthalmic solution Place 1 drop into the right eye at bedtime.     TRULICITY  0.75 MG/0.5ML SOAJ INJECT THE CONTENTS OF ONE PEN  SUBCUTANEOUSLY WEEKLY AS  DIRECTED 6 mL 3   valsartan -hydrochlorothiazide  (DIOVAN -HCT) 320-25 MG tablet TAKE 1 TABLET BY MOUTH DAILY 100 tablet 2   VASCEPA  1 g capsule TAKE 2 CAPSULES BY MOUTH TWICE  DAILY 400 capsule 2   XIIDRA 5 % SOLN      No current facility-administered medications on file prior to visit.   Family History  Problem Relation Age of Onset   Diabetes Mother    Diabetes Brother    Hearing loss Daughter    Hypertension Other    Cancer Neg Hx    Alcohol abuse Neg Hx    Early death Neg Hx    Heart disease Neg Hx    Hyperlipidemia Neg Hx    Stroke Neg Hx     Social History   Socioeconomic History   Marital status: Divorced    Spouse name: Not on file   Number of children: 7   Years of education: Not on file   Highest education level: Not on file  Occupational History   Occupation: Retired   Occupation: retired  Tobacco Use   Smoking status: Former   Smokeless tobacco: Never   Tobacco comments:    Stopped 30 years  ago  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 Used  Substance and Sexual Activity   Alcohol use: No   Drug use: No   Sexual activity: Yes  Other Topics Concern   Not on file  Social History Narrative   Not on file   Social Drivers of Health   Financial Resource Strain: Not on file  Food Insecurity: Not on file  Transportation Needs: Not on file  Physical Activity: Not on file  Stress: Not on file  Social Connections: Not on file  Intimate Partner Violence: Not on file  Objective:  Physical Exam: There were no vitals taken for this visit.   Physical Exam           Physical Exam  No results found.  Recent Results (from the past 2160 hours)  Microalbumin / creatinine urine ratio     Status: None   Collection Time: 05/06/24  3:12 PM  Result Value Ref Range   Microalb, Ur 1.2 0.0 - 1.9 mg/dL   Creatinine,U 23.6 mg/dL   Microalb Creat Ratio 16.1 0.0 - 30.0 mg/g         Beverley KATHEE Hummer, MD  I,Emily Lagle,acting as a scribe for Beverley KATHEE Hummer, MD.,have documented all relevant documentation on the behalf of Beverley KATHEE Hummer, MD,as directed by  Beverley KATHEE Hummer, MD while in the presence of Beverley KATHEE Hummer, MD.   I, Beverley KATHEE Hummer, MD, have reviewed all documentation for this visit. The documentation on 06/05/2024 for the exam, diagnosis, procedures, and orders are all accurate and complete.

## 2024-06-09 ENCOUNTER — Other Ambulatory Visit: Payer: Self-pay | Admitting: Family Medicine

## 2024-06-11 ENCOUNTER — Encounter: Payer: Self-pay | Admitting: Family Medicine

## 2024-06-23 ENCOUNTER — Other Ambulatory Visit: Payer: Self-pay | Admitting: Family Medicine

## 2024-06-23 DIAGNOSIS — E1139 Type 2 diabetes mellitus with other diabetic ophthalmic complication: Secondary | ICD-10-CM

## 2024-06-29 ENCOUNTER — Ambulatory Visit
Admission: EM | Admit: 2024-06-29 | Discharge: 2024-06-29 | Disposition: A | Discharge status: Home / Self Care | Attending: Family Medicine | Admitting: Family Medicine

## 2024-06-29 DIAGNOSIS — M79672 Pain in left foot: Secondary | ICD-10-CM | POA: Diagnosis not present

## 2024-06-29 DIAGNOSIS — B349 Viral infection, unspecified: Secondary | ICD-10-CM

## 2024-06-29 DIAGNOSIS — S91312A Laceration without foreign body, left foot, initial encounter: Secondary | ICD-10-CM

## 2024-06-29 LAB — POC COVID19/FLU A&B COMBO
Influenza A Antigen, POC: NEGATIVE
Influenza B Antigen, POC: NEGATIVE

## 2024-06-29 MED ORDER — CETIRIZINE HCL 1 MG/ML PO SOLN: 5.0000 mg | Freq: Every day | ORAL | 0 refills | Status: AC

## 2024-06-29 MED ORDER — PROMETHAZINE-DM 6.25-15 MG/5ML PO SYRP
5.0000 mL | ORAL_SOLUTION | Freq: Three times a day (TID) | ORAL | 0 refills | Status: AC | PRN
Start: 2024-06-29 — End: ?

## 2024-06-29 MED ORDER — BENZONATATE 100 MG PO CAPS: 100.0000 mg | ORAL_CAPSULE | Freq: Three times a day (TID) | ORAL | 0 refills | Status: AC | PRN

## 2024-06-29 NOTE — ED Provider Notes (Signed)
 [facility]  Note:  This document was prepared using Conservation officer, historic buildings and may include unintentional dictation errors.  MRN: [id number] DOB: 1940/12/18  Subjective:   Adrian [name] is a 83 y.o. male presenting for 2-day history of persistent runny nose, stuffy nose, headaches, drainage, coughing.  No fever, chest pain, shortness of breath or wheezing.  Patient also wants to be evaluated for left heel pain.  Believes he suffered a cut days ago.  No drainage, bleeding, redness, swelling.  He is a type II diabetic.  Blood sugars are well-controlled.  No history of asthma.  No smoking.  No current facility-administered medications for this encounter.  Current Outpatient Medications:    benzocaine-menthol (CHLORASEPTIC) 6-10 MG lozenge, Take 1 lozenge by mouth as needed for sore throat., Disp: , Rfl:    loratadine (CLARITIN REDITABS) 10 MG dissolvable tablet, Take 10 mg by mouth daily., Disp: , Rfl:    menthol (CEPACOL) 3 MG lozenge, Take 1 lozenge by mouth as needed for sore throat., Disp: , Rfl:    ACCU-CHEK GUIDE TEST test strip, TEST 3 TIMES DAILY, Disp: 300 strip, Rfl: 2   Blood Glucose Monitoring Suppl DEVI, 1 each by Does not apply route in the morning, at noon, and at bedtime. May substitute to any manufacturer covered by patient's insurance., Disp: 1 each, Rfl: 0   brimonidine (ALPHAGAN) 0.2 % ophthalmic solution, Place 1 drop into the right eye 2 (two) times daily., Disp: , Rfl:    clobetasol  ointment (TEMOVATE ) 0.05 %, Apply 1 Application topically 2 (two) times daily., Disp: 30 g, Rfl: 0   dorzolamide (TRUSOPT) 2 % ophthalmic solution, Place 1 drop into the right eye 2 (two) times daily., Disp: , Rfl:    dorzolamide-timolol (COSOPT) 2-0.5 % ophthalmic solution, Place 1 drop into the right eye 2 (two) times daily., Disp: , Rfl:    Empagliflozin-metFORMIN  HCl ER (SYNJARDY  XR) 05-999 MG TB24, Take 1 tablet by mouth daily., Disp: 90 tablet, Rfl:  3   famotidine (PEPCID) 40 MG tablet, Take 40 mg by mouth daily., Disp: , Rfl:    fluocinonide -emollient (LIDEX -E) 0.05 % cream, APPLY TO AFFECTED AREA TWICE A DAY, Disp: 60 g, Rfl: 2   hydrOXYzine  (VISTARIL ) 25 MG capsule, Take 1 capsule (25 mg total) by mouth every 8 (eight) hours as needed., Disp: 30 capsule, Rfl: 0   Lancets (ONETOUCH DELICA PLUS LANCET33G) MISC, USE 1 LANCET IN THE MORNING AT  NOON, AND AT BEDTIME, Disp: 300 each, Rfl: 2   pravastatin  (PRAVACHOL ) 80 MG tablet, Take 1 tablet (80 mg total) by mouth daily., Disp: 90 tablet, Rfl: 3   Travoprost, BAK Free, (TRAVATAN) 0.004 % SOLN ophthalmic solution, Place 1 drop into the right eye at bedtime., Disp: , Rfl:    TRULICITY  0.75 MG/0.5ML SOAJ, INJECT THE CONTENTS OF ONE PEN  SUBCUTANEOUSLY WEEKLY AS  DIRECTED, Disp: 6 mL, Rfl: 3   valsartan -hydrochlorothiazide  (DIOVAN -HCT) 320-25 MG tablet, TAKE 1 TABLET BY MOUTH DAILY, Disp: 100 tablet, Rfl: 2   VASCEPA  1 g capsule, TAKE 2 CAPSULES BY MOUTH TWICE  DAILY, Disp: 400 capsule, Rfl: 2   XIIDRA 5 % SOLN, , Disp: , Rfl:    Allergies  Allergen Reactions   Lisinopril     REACTION: cough   Lisinopril-Hydrochlorothiazide  Cough    Past Medical History:  Diagnosis Date   Benign neoplasm of colon 05/01/2008   Qualifier: Diagnosis of  By: Joshua MD, Debby CROME.    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 pylori)    Hernia, inguinal, left 04/10/2012   HYPERCHOLESTEROLEMIA    Hypertriglyceridemia 12/07/2010   Pain of right thigh 06/08/2011   Parotid mass 12/13/2012   Personal history of colonic polyps    Plantar fasciitis, right 01/08/2012   Rash and nonspecific skin  eruption 07/17/2013   RENAL CYST    Sciatica of right side 06/08/2011   Sialadenitis 12/13/2012   Tinea cruris 04/10/2012     Past Surgical History:  Procedure Laterality Date   CATARACT EXTRACTION     EYE SURGERY     (R) glaucoma (to relieve pressure)   HERNIA REPAIR     right inguinal   INGUINAL HERNIA REPAIR  04/29/2012   Procedure: HERNIA REPAIR INGUINAL ADULT;  Surgeon: Donnice POUR. Tsuei, MD;  Location: [facility] OR;  Service: General;  Laterality: Left;  Left inguinal hernia repair w/mesh   Left rotator cuff repair     right wrist surgery      Family History  Problem Relation Age of Onset   Diabetes Mother    Diabetes Brother    Hearing loss Daughter    Hypertension Other    Cancer Neg Hx    Alcohol abuse Neg Hx    Early death Neg Hx    Heart disease Neg Hx    Hyperlipidemia Neg Hx    Stroke Neg Hx     Social History   Tobacco Use   Smoking status: Former   Smokeless tobacco: Never   Tobacco comments:    Stopped 30 years ago  Vaping Use   Vaping status: Never Used  Substance Use Topics   Alcohol use: No   Drug use: No    ROS   Objective:   Vitals: BP (!) 159/74 (BP Location: Left Arm)   Pulse 68   Temp 98.2 F (36.8 C) (Oral)   Resp 18   SpO2 95%   Physical Exam Constitutional:      General: He is not in acute distress.    Appearance: Normal appearance. He is well-developed and normal weight. He is not ill-appearing, toxic-appearing or diaphoretic.  HENT:     Head: Normocephalic and atraumatic.     Right Ear: External ear normal.     Left Ear: External ear normal.     Nose: Nose normal.     Mouth/Throat:     Pharynx: Oropharynx is clear.  Eyes:     General: No scleral icterus.       Right eye: No discharge.        Left eye: No discharge.     Extraocular Movements: Extraocular movements intact.  Cardiovascular:     Rate and Rhythm: Normal rate.  Pulmonary:     Effort: Pulmonary effort is normal.  Musculoskeletal:     Cervical back: Normal range  of motion.  Neurological:     Mental Status: He is alert and oriented to person, place, and time.  Psychiatric:        Mood and Affect: Mood normal.        Behavior: Behavior normal.        Thought Content: Thought content normal.  Judgment: Judgment normal.    Results for orders placed or performed during the hospital encounter of 06/29/24 (from the past 24 hours)  POC Covid19/Flu A&B Antigen     Status: Normal   Collection Time: 06/29/24 11:30 AM  Result Value Ref Range   Influenza A Antigen, POC Negative Negative   Influenza B Antigen, POC Negative Negative   Covid Antigen, POC  Negative     Assessment and Plan :   PDMP not reviewed this encounter.  1. Acute viral syndrome   2. Left foot pain   3. Laceration of left foot, initial encounter     Suspect viral URI, viral syndrome. Physical exam findings reassuring and vital signs stable for discharge.  Creatinine Clearance calculated at 62mL/min.  Advised supportive care, offered symptomatic relief.  No signs of secondary infection for his left foot laceration/fissure.  Recommended wound care.  Counseled patient on potential for adverse effects with medications prescribed/recommended today, ER and return-to-clinic precautions discussed, patient verbalized understanding.     Christopher Savannah, PA-C 06/29/24 1135

## 2024-06-29 NOTE — ED Triage Notes (Signed)
 Pt reports cough, headache, runny nose x 3 days after he was cutting trees. Tylenol , Cepacol, Claritin and Chloraseptic gives no relief.

## 2024-06-29 NOTE — Discharge Instructions (Signed)
 We will manage this as a viral respiratory infection. For sore throat or cough try using a honey-based tea. Use 3 teaspoons of honey with juice squeezed from half lemon. Place shaved pieces of ginger into 1/2-1 cup of water and warm over stove top. Then mix the ingredients and repeat every 4 hours as needed. Please take Tylenol  500mg -650mg  once every 6 hours for fevers, aches and pains. Hydrate very well with at least 2 liters (64 ounces) of water. Eat light meals such as soups (chicken and noodles, chicken wild rice, vegetable).  Do not eat any foods that you are allergic to.  Start an antihistamine like Zyrtec  (10mg  daily) for postnasal drainage, sinus congestion.  You can take this together with cough capsules or cough syrup as needed.

## 2024-07-21 ENCOUNTER — Other Ambulatory Visit: Payer: Self-pay | Admitting: Family Medicine

## 2024-07-21 DIAGNOSIS — E1139 Type 2 diabetes mellitus with other diabetic ophthalmic complication: Secondary | ICD-10-CM

## 2024-07-22 NOTE — Telephone Encounter (Signed)
 Contacted patient to inform him he is due for an appointment. Scheduled patient for Jan 30 th at 10 am. Informed patient that I will only be able to fill his prescription up until his appointment which is in 45 days. Patient expressed understanding. Offered a sooner appointment to patient and he expressed to me that he is taking care of his fiance bc she just had knee surgery.

## 2024-09-03 ENCOUNTER — Other Ambulatory Visit: Payer: Self-pay | Admitting: Family Medicine

## 2024-09-03 DIAGNOSIS — E1139 Type 2 diabetes mellitus with other diabetic ophthalmic complication: Secondary | ICD-10-CM

## 2024-09-04 NOTE — Telephone Encounter (Signed)
 Requesting: TRULICITY  0.75 MG/0.5ML SOAJ Last Visit: 05/06/2024 Next Visit: 09/05/2024 Last Refill: 07/22/2024  Please Advise

## 2024-09-05 ENCOUNTER — Ambulatory Visit: Admitting: Family Medicine

## 2024-09-05 NOTE — Progress Notes (Incomplete)
 " Assessment & Plan   Assessment/Plan:    Problem List Items Addressed This Visit   None       Assessment and Plan               There are no discontinued medications.  No follow-ups on file.        Subjective:   Encounter date: 09/05/2024  Adrian [name] is a 84 y.o. male who has DM (diabetes mellitus), type 2 (HCC); HYPERCHOLESTEROLEMIA; ERECTILE DYSFUNCTION; Chronic angle-closure glaucoma(365.23); Hypertension associated with type 2 diabetes mellitus (HCC); GASTRITIS; RENAL CYST; Hypertriglyceridemia; DJD (degenerative joint disease); Tinea cruris; Eczema; BPH (benign prostatic hyperplasia); Routine general medical examination at a health care facility; IBS (irritable bowel syndrome); Chronic abdominal pain; Right hip pain; Low back pain; Headache; Bradycardia; Atypical chest pain; Mixed hyperlipidemia; Glaucoma of both eyes; and Chronic rhinitis on their problem list..   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 pylori), Hernia, inguinal, left (04/10/2012), HYPERCHOLESTEROLEMIA, Hypertriglyceridemia (12/07/2010), Pain of right thigh (06/08/2011), Parotid mass (12/13/2012), Personal history of colonic polyps, Plantar fasciitis, right (01/08/2012), Rash and nonspecific skin eruption (07/17/2013), RENAL CYST, Sciatica of right side (06/08/2011), Sialadenitis (12/13/2012), and Tinea cruris (04/10/2012).[name]   He presents with chief complaint of No chief complaint on file. .   Discussed the use of AI scribe software for clinical note transcription with the patient, who gave verbal consent to  proceed.  History of Present Illness          Hypertension - When seen in the urgent care on 06/29/2024, his blood pressure was noted to be 159/74 mmHg.  - He does take Valsartan -HCTZ 320-25 mg daily. BP Readings from Last 3 Encounters:  06/29/24 (!) 159/74  05/06/24 124/60  09/11/23 98/60     ROS  Past Surgical History:  Procedure Laterality Date   CATARACT EXTRACTION     EYE SURGERY     (R) glaucoma (to relieve pressure)   HERNIA REPAIR     right inguinal   INGUINAL HERNIA REPAIR  04/29/2012   Procedure: HERNIA REPAIR INGUINAL ADULT;  Surgeon: Donnice POUR. Belinda, MD;  Location: [facility] OR;  Service: General;  Laterality: Left;  Left inguinal hernia repair w/mesh   Left rotator cuff repair     right wrist surgery      Medications Ordered Prior to Encounter[1]  Family History  Problem Relation Age of Onset   Diabetes Mother    Diabetes Brother    Hearing loss Daughter    Hypertension Other    Cancer Neg Hx    Alcohol abuse Neg Hx    Early death Neg Hx    Heart disease Neg Hx    Hyperlipidemia Neg Hx    Stroke Neg Hx     Social History   Socioeconomic History   Marital status: Divorced    Spouse name: Not on file   Number of children: 7   Years of education: Not on file   Highest education level: Not on file  Occupational History   Occupation: Retired   Occupation: retired  Tobacco Use   Smoking status: Former   Smokeless tobacco: Never   Tobacco comments:    Stopped 30 years ago  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 Used  Substance  and Sexual Activity   Alcohol use: No   Drug use: No   Sexual activity: Not Currently  Other Topics Concern   Not on file  Social History Narrative   Not on file   Social Drivers of Health   Tobacco Use: Medium Risk (06/29/2024)   Patient History    Smoking Tobacco Use: Former    Smokeless Tobacco Use: Never    Passive Exposure: Not on Actuary Strain: Not on file  Food Insecurity: Not on file   Transportation Needs: Not on file  Physical Activity: Not on file  Stress: Not on file  Social Connections: Not on file  Intimate Partner Violence: Not on file  Depression (PHQ2-9): Low Risk (05/06/2024)   Depression (PHQ2-9)    PHQ-2 Score: 0  Alcohol Screen: Not on file  Housing: Not on file  Utilities: Not on file  Health Literacy: Not on file                                                                                                  Objective:  Physical Exam: There were no vitals taken for this visit.   Physical Exam           Physical Exam  No results found.  Recent Results (from the past 2160 hours)  POC Covid19/Flu A&B Antigen     Status: Normal   Collection Time: 06/29/24 11:30 AM  Result Value Ref Range   Influenza A Antigen, POC Negative Negative   Influenza B Antigen, POC Negative Negative   Covid Antigen, POC  Negative        Beverley KATHEE Hummer, MD  I,Emily Lagle,acting as a scribe for Beverley KATHEE Hummer, MD.,have documented all relevant documentation on the behalf of Beverley KATHEE Hummer, MD.  LILLETTE Beverley KATHEE Hummer, MD, have reviewed all documentation for this visit. The documentation on 09/05/2024 for the exam, diagnosis, procedures, and orders are all accurate and complete.     [1]  Current Outpatient Medications on File Prior to Visit  Medication Sig Dispense Refill   ACCU-CHEK GUIDE TEST test strip TEST 3 TIMES DAILY 300 strip 2   benzocaine-menthol (CHLORASEPTIC) 6-10 MG lozenge Take 1 lozenge by mouth as needed for sore throat.     benzonatate  (TESSALON ) 100 MG capsule Take 1 capsule (100 mg total) by mouth 3 (three) times daily as needed for cough. 30 capsule 0   Blood Glucose Monitoring Suppl DEVI 1 each by Does not apply route in the morning, at noon, and at bedtime. May substitute to any manufacturer covered by patient's insurance. 1 each 0   brimonidine (ALPHAGAN) 0.2 % ophthalmic solution Place 1 drop into the right eye 2 (two) times daily.      cetirizine  HCl (ZYRTEC ) 1 MG/ML solution Take 5 mLs (5 mg total) by mouth daily. 300 mL 0   clobetasol  ointment (TEMOVATE ) 0.05 % Apply 1 Application topically 2 (two) times daily. 30 g 0   dorzolamide (TRUSOPT) 2 % ophthalmic solution Place 1 drop into the right eye 2 (two) times daily.     dorzolamide-timolol (  COSOPT) 2-0.5 % ophthalmic solution Place 1 drop into the right eye 2 (two) times daily.     Empagliflozin-metFORMIN  HCl ER (SYNJARDY  XR) 05-999 MG TB24 TAKE 1 TABLET BY MOUTH DAILY 45 tablet 0   famotidine (PEPCID) 40 MG tablet Take 40 mg by mouth daily.     fluocinonide -emollient (LIDEX -E) 0.05 % cream APPLY TO AFFECTED AREA TWICE A DAY 60 g 2   hydrOXYzine  (VISTARIL ) 25 MG capsule Take 1 capsule (25 mg total) by mouth every 8 (eight) hours as needed. 30 capsule 0   Lancets (ONETOUCH DELICA PLUS LANCET33G) MISC USE 1 LANCET IN THE MORNING AT  NOON, AND AT BEDTIME 300 each 2   loratadine (CLARITIN REDITABS) 10 MG dissolvable tablet Take 10 mg by mouth daily.     menthol (CEPACOL) 3 MG lozenge Take 1 lozenge by mouth as needed for sore throat.     pravastatin  (PRAVACHOL ) 80 MG tablet Take 1 tablet (80 mg total) by mouth daily. 90 tablet 3   promethazine -dextromethorphan (PROMETHAZINE -DM) 6.25-15 MG/5ML syrup Take 5 mLs by mouth 3 (three) times daily as needed for cough. 200 mL 0   Travoprost, BAK Free, (TRAVATAN) 0.004 % SOLN ophthalmic solution Place 1 drop into the right eye at bedtime.     TRULICITY  0.75 MG/0.5ML SOAJ INJECT THE CONTENTS OF ONE PEN  SUBCUTANEOUSLY WEEKLY AS  DIRECTED 2 mL 11   valsartan -hydrochlorothiazide  (DIOVAN -HCT) 320-25 MG tablet TAKE 1 TABLET BY MOUTH DAILY 100 tablet 2   VASCEPA  1 g capsule TAKE 2 CAPSULES BY MOUTH TWICE  DAILY 400 capsule 2   XIIDRA 5 % SOLN      No current facility-administered medications on file prior to visit.   "

## 2024-09-07 ENCOUNTER — Other Ambulatory Visit: Payer: Self-pay | Admitting: Family Medicine

## 2024-09-07 DIAGNOSIS — E1139 Type 2 diabetes mellitus with other diabetic ophthalmic complication: Secondary | ICD-10-CM

## 2024-12-09 ENCOUNTER — Ambulatory Visit: Admitting: Physician Assistant
